# Patient Record
Sex: Male | Born: 1980 | Race: White | Hispanic: No | Marital: Married | Smoking: Current every day smoker
Health system: Southern US, Community
[De-identification: ages and names within clinical notes are randomized; demographics above are authoritative.]

## PROBLEM LIST (undated history)

## (undated) DIAGNOSIS — K219 Gastro-esophageal reflux disease without esophagitis: Secondary | ICD-10-CM

## (undated) DIAGNOSIS — F410 Panic disorder [episodic paroxysmal anxiety] without agoraphobia: Secondary | ICD-10-CM

## (undated) HISTORY — PX: NO PAST SURGERIES: SHX2092

---

## 2016-09-02 ENCOUNTER — Emergency Department: Payer: 59

## 2016-09-02 ENCOUNTER — Emergency Department
Admission: EM | Admit: 2016-09-02 | Discharge: 2016-09-03 | Disposition: A | Discharge status: Home / Self Care | Payer: 59 | Attending: Emergency Medicine | Admitting: Emergency Medicine

## 2016-09-02 DIAGNOSIS — S82851A Displaced trimalleolar fracture of right lower leg, initial encounter for closed fracture: Secondary | ICD-10-CM | POA: Diagnosis not present

## 2016-09-02 DIAGNOSIS — W010XXA Fall on same level from slipping, tripping and stumbling without subsequent striking against object, initial encounter: Secondary | ICD-10-CM | POA: Diagnosis not present

## 2016-09-02 DIAGNOSIS — F17200 Nicotine dependence, unspecified, uncomplicated: Secondary | ICD-10-CM | POA: Diagnosis not present

## 2016-09-02 DIAGNOSIS — Y999 Unspecified external cause status: Secondary | ICD-10-CM | POA: Diagnosis not present

## 2016-09-02 DIAGNOSIS — Y939 Activity, unspecified: Secondary | ICD-10-CM | POA: Diagnosis not present

## 2016-09-02 DIAGNOSIS — S99911A Unspecified injury of right ankle, initial encounter: Secondary | ICD-10-CM | POA: Diagnosis present

## 2016-09-02 DIAGNOSIS — Z8781 Personal history of (healed) traumatic fracture: Secondary | ICD-10-CM

## 2016-09-02 DIAGNOSIS — Y929 Unspecified place or not applicable: Secondary | ICD-10-CM | POA: Insufficient documentation

## 2016-09-02 NOTE — ED Triage Notes (Signed)
Pt presents to ED with c/o RIGHT ankle pain s/p fall on ice PTA. Pt denies any c/o lightheadedness, dizziness prior to mechanical fall. Pt arrives looking pale, states his blood sugar is low although he is not diabetic. Pt with swelling noted to ankle, without deformity. Skin is WPD, pedal pulses present and strong, cap refill <3 secs.

## 2016-09-03 ENCOUNTER — Emergency Department: Payer: 59

## 2016-09-03 MED ORDER — OXYCODONE-ACETAMINOPHEN 5-325 MG PO TABS: 1.0000 | ORAL_TABLET | ORAL | 0 refills | Status: DC | PRN

## 2016-09-03 MED ORDER — DOCUSATE SODIUM 100 MG PO CAPS: ORAL_CAPSULE | ORAL | 0 refills | Status: DC

## 2016-09-03 MED ORDER — OXYCODONE-ACETAMINOPHEN 5-325 MG PO TABS
2.0000 | ORAL_TABLET | Freq: Once | ORAL | Status: AC
Start: 2016-09-03 — End: 2016-09-03
  Administered 2016-09-03: 2 via ORAL
  Filled 2016-09-03: qty 2

## 2016-09-03 NOTE — Consult Note (Signed)
ORTHOPAEDIC CONSULTATION  REQUESTING PHYSICIAN: Loleta Roseory Forbach, MD  Chief Complaint: right ankle pain and swelling  HPI: Turner DanielsJahue W Kilker is a 36 y.o. male who complains of  Right ankle pain and swelling after a fall on the ice at 10 PM. He had immediate pain, 7/10 with inability to bear weight on his right leg. He denies any LOC. He denies any medical problems and does not take any prescription medications. He smokes 1 PPD. He lives at home with his wife and son.  History reviewed. No pertinent past medical history. History reviewed. No pertinent surgical history. Social History   Social History  . Marital status: Married    Spouse name: N/A  . Number of children: N/A  . Years of education: N/A   Social History Main Topics  . Smoking status: Current Every Day Smoker  . Smokeless tobacco: Never Used  . Alcohol use Yes  . Drug use: No  . Sexual activity: Yes    Birth control/ protection: None   Other Topics Concern  . None   Social History Narrative  . None   No family history on file. No Known Allergies Prior to Admission medications   Not on File   Dg Ankle Complete Right  Result Date: 09/02/2016 CLINICAL DATA:  36 y/o  M; right ankle pain and swelling after fall. EXAM: RIGHT ANKLE - COMPLETE 3+ VIEW COMPARISON:  None. FINDINGS: Comminuted oblique right lower fibular fracture above level of tibial plafond. Moderate avulsion of the medial malleolus. Mildly displaced posterior malleolar fracture. Talar dome is intact. Widening of the medial ankle mortise indicates joint instability. Soft tissue swelling about the ankle joint. Small plantar calcaneal enthesophyte. IMPRESSION: Lower fibular fracture above level of tibial plafond. Moderately displaced medial malleolus fracture. Mildly displaced posterior malleolus fracture. Lateral subluxation of talar dome indicating joint instability. Electronically Signed   By: Mitzi HansenLance  Furusawa-Stratton M.D.   On: 09/02/2016 23:51   I reviewed the  radiographs and agree with the assessment of a trimalleolar right ankle fracture.  Positive ROS: All other systems have been reviewed and were otherwise negative with the exception of those mentioned in the HPI and as above.  Physical Exam: General: Alert, no acute distress Cardiovascular: No pedal edema Respiratory: No cyanosis, no use of accessory musculature GI: No organomegaly, abdomen is soft and non-tender Skin: No lesions in the area of chief complaint Neurologic: Sensation intact distally Psychiatric: Patient is competent for consent with normal mood and affect Lymphatic: No axillary or cervical lymphadenopathy  MUSCULOSKELETAL: Marked swelling medially and laterally with no skin wrinkling and fracture blister present along the anteromedial aspect of the distal tibia. He is nontender to palpation about the proximal tibia and knee. He has a 2+ DP pulse, but unable to palpate PT pulse secondary to the amount of swelling present.  Assessment: 36 y/o male with right trimalleolar ankle fracture. Given the amount of soft tissue swelling already present just 2 hours after the injury and the presence of a fracture blister, he is at risk for wound healing complications and infection if we proceed to surgery today. I believe he will benefit from a period of strict elevation and immobilization to allow for soft tissue rest/ resolution of his swelling prior to having surgery. I explained that it could be anywhere from 7-14 days for the swelling to adequately improve.   Plan: 1. Patient was placed into a molded "L and U" splint 2. He was instructed on strict elevation with a goal of maintaining  his leg at or above the level of his heart 3. He was counseled on the risks of DVT/PE along with signs and symptoms. He was instructed to get up on his crutches at least once an hour to help mitigate the risks. He was also counseled to return to the hospital immediately if he develops increasing leg pain,  swelling, chest pain, or shortness of breath. 4. He was counseled on the benefits of tobacco cessation with regards to bone healing. 5. He will follow-up with Dr. Elenor Legato office next week for skin check and surgical planning.       09/03/2016 1:16 AM

## 2016-09-03 NOTE — ED Notes (Signed)
Orthopedic surgeon at bedside. 

## 2016-09-03 NOTE — ED Notes (Signed)
Orthopedic surgeon at bedside to apply splint

## 2016-09-03 NOTE — ED Provider Notes (Signed)
[facility] Emergency Department Provider Note  ____________________________________________   First MD Initiated Contact with Patient 09/03/16 0003     (approximate)  I have reviewed the triage vital signs and the nursing notes.   HISTORY  Chief Complaint Ankle Injury and Fall    HPI Richard Villarreal is a 36 y.o. male with no significant PMH who presents for evaluation of acute onset right ankle pain after slipping on the ice.  No other injuries, did not hit head, no LOC.  Acute onset of severe sharp pain which is worse with any movement of his ankle.  Unable to bear weight.  Moderate swelling. No numbness/tingling, able to move toes and foot.  No injuries higher on the leg.   History reviewed. No pertinent past medical history.  There are no active problems to display for this patient.   History reviewed. No pertinent surgical history.  Prior to Admission medications   Medication Sig Start Date End Date Taking? Authorizing Provider  docusate sodium (COLACE) 100 MG capsule Take 1 tablet once or twice daily as needed for constipation while taking narcotic pain medicine 09/03/16   Loleta Rose, MD  oxyCODONE-acetaminophen (ROXICET) 5-325 MG tablet Take 1-2 tablets by mouth every 4 (four) hours as needed for severe pain. 09/03/16   Loleta Rose, MD    Allergies Patient has no known allergies.  No family history on file.  Social History Social History  Substance Use Topics  . Smoking status: Current Every Day Smoker  . Smokeless tobacco: Never Used  . Alcohol use Yes    Review of Systems Constitutional: No fever/chills Eyes: No visual changes. ENT: No sore throat. Cardiovascular: Denies chest pain. Respiratory: Denies shortness of breath. Gastrointestinal: No abdominal pain.  No nausea, no vomiting.  No diarrhea.  No constipation. Genitourinary: Negative for dysuria. Musculoskeletal: Severe pain in right ankle Skin: Negative for  rash. Neurological: Negative for headaches, focal weakness or numbness.  10-point ROS otherwise negative.  ____________________________________________   PHYSICAL EXAM:  VITAL SIGNS: ED Triage Vitals  Enc Vitals Group     BP 09/02/16 2308 128/90     Pulse Rate 09/02/16 2308 91     Resp 09/02/16 2308 18     Temp 09/02/16 2308 97.8 F (36.6 C)     Temp Source 09/02/16 2308 Oral     SpO2 09/02/16 2308 97 %     Weight 09/02/16 2305 230 lb (104.3 kg)     Height 09/02/16 2305 5\' 8"  (1.727 m)     Head Circumference --      Peak Flow --      Pain Score 09/02/16 2305 10     Pain Loc --      Pain Edu? --      Excl. in GC? --     Constitutional: Alert and oriented. Well appearing and in no acute distress. Eyes: Conjunctivae are normal. PERRL. EOMI. Head: Atraumatic. Nose: No congestion/rhinnorhea. Mouth/Throat: Mucous membranes are moist.  Oropharynx non-erythematous. Neck: No stridor.  No meningeal signs.  No cervical spine tenderness to palpation. Cardiovascular: Normal rate, regular rhythm. Good peripheral circulation. Grossly normal heart sounds. Respiratory: Normal respiratory effort.  No retractions. Lungs CTAB. Gastrointestinal: Soft and nontender. No distention.  Musculoskeletal: Swelling of the right ankle.  N/V intact with normal cap refill and distal pulses.  Severe pain with attempted ROM and palpation, but able to move the foot with some plantar flexion and able to wiggle toes.  No grossly displaced fracture  evident on external visualization. Neurologic:  Normal speech and language. No gross focal neurologic deficits are appreciated.  Skin:  Skin is warm, dry and intact. No rash noted. Psychiatric: Mood and affect are normal. Speech and behavior are normal.  ____________________________________________   LABS (all labs ordered are listed, but only abnormal results are displayed)  Labs Reviewed - No data to  display ____________________________________________  EKG  None - EKG not ordered by ED physician ____________________________________________  RADIOLOGY   Dg Ankle 2 Views Right  Result Date: 09/03/2016 CLINICAL DATA:  Post reduction EXAM: RIGHT ANKLE - 2 VIEW COMPARISON:  09/02/2016 FINDINGS: Interim casting of the right ankle. Comminuted distal fibular fracture with similar alignment. Medial malleolar fracture with decreased displacement and separation. Posterior malleolar fracture similar in alignment. IMPRESSION: Interim casting of the right ankle. Decreased displacement of medial malleolar fracture and less widening of the medial mortise. Distal fibular and posterior malleolar fractures are similar in alignment. Electronically Signed   By: Jasmine PangKim  Fujinaga M.D.   On: 09/03/2016 02:36   Dg Ankle Complete Right  Result Date: 09/02/2016 CLINICAL DATA:  36 y/o  M; right ankle pain and swelling after fall. EXAM: RIGHT ANKLE - COMPLETE 3+ VIEW COMPARISON:  None. FINDINGS: Comminuted oblique right lower fibular fracture above level of tibial plafond. Moderate avulsion of the medial malleolus. Mildly displaced posterior malleolar fracture. Talar dome is intact. Widening of the medial ankle mortise indicates joint instability. Soft tissue swelling about the ankle joint. Small plantar calcaneal enthesophyte. IMPRESSION: Lower fibular fracture above level of tibial plafond. Moderately displaced medial malleolus fracture. Mildly displaced posterior malleolus fracture. Lateral subluxation of talar dome indicating joint instability. Electronically Signed   By: Mitzi HansenLance  Furusawa-Stratton M.D.   On: 09/02/2016 23:51    ____________________________________________   PROCEDURES  Procedure(s) performed:   Procedures   Critical Care performed: No ____________________________________________   INITIAL IMPRESSION / ASSESSMENT AND PLAN / ED COURSE  Pertinent labs & imaging results that were available  during my care of the patient were reviewed by me and considered in my medical decision making (see chart for details).  Unstable ankle fracture.  N/V intact.  No indication for emergent reduction.  Will page orthopedics to discuss appropriate management.  Holding on splint until discussion with specialist.   Clinical Course as of Sep 03 242  Sat Sep 03, 2016  0030 spoke by phone with Dr. Rodena MedinEckel with orthopedic surgery.  He will come in to the ED to evaluate the patient in person and discuss surgical management.  [CF]  [phone] Dr. Rodena MedinEckel evaluated the patient in person and feels he is not a good immediate surgical candidate due to the amount of swelling the patient already has.  He is going to splint the patient and the patient will follow-up with Dr. Ernest PineHooten next week for fixation.  [CF]  [phone] Radiology report on post-reduction films still pending but to my viewing they look improved.  Patient's pain well controlled.  Will discharge as per Dr. Jonette EvaEckel's plan/recommendations.  [CF]  0240 I reviewed the patient's prescription history over the last 12 months in the [place] Controlled Substances Database, and he has had no controlled substances prescribed during that time.  [CF]  [id number] Radiology report does show interval improvement in alignment of fracture.  [CF]    Clinical Course User Index [CF] Loleta Roseory Georgeana Oertel, MD    ____________________________________________  FINAL CLINICAL IMPRESSION(S) / ED DIAGNOSES  Final diagnoses:  Closed right trimalleolar fracture, initial encounter     MEDICATIONS  GIVEN DURING THIS VISIT:  Medications  oxyCODONE-acetaminophen (PERCOCET/ROXICET) 5-325 MG per tablet 2 tablet (2 tablets Oral Given 09/03/16 0034)     NEW OUTPATIENT MEDICATIONS STARTED DURING THIS VISIT:  New Prescriptions   DOCUSATE SODIUM (COLACE) 100 MG CAPSULE    Take 1 tablet once or twice daily as needed for constipation while taking narcotic pain medicine   OXYCODONE-ACETAMINOPHEN (ROXICET) 5-325  MG TABLET    Take 1-2 tablets by mouth every 4 (four) hours as needed for severe pain.    Modified Medications   No medications on file    Discontinued Medications   No medications on file     Note:  This document was prepared using Dragon voice recognition software and may include unintentional dictation errors.    Loleta Rose, MD 09/03/16 [phone]

## 2016-09-03 NOTE — Discharge Instructions (Signed)
Please do not bear weight on your affected leg.  Keep your leg elevated when possible and use ice/cold packs when possible as well.  Follow up next week with Dr. Ernest PineHooten as described above.    Return to the emergency department if you develop new or worsening symptoms that concern you.  Take Percocet as prescribed for severe pain. Do not drink alcohol, drive or participate in any other potentially dangerous activities while taking this medication as it may make you sleepy. Do not take this medication with any other sedating medications, either prescription or over-the-counter. If you were prescribed Percocet or Vicodin, do not take these with acetaminophen (Tylenol) as it is already contained within these medications.   This medication is an opiate (or narcotic) pain medication and can be habit forming.  Use it as little as possible to achieve adequate pain control.  Do not use or use it with extreme caution if you have a history of opiate abuse or dependence.  If you are on a pain contract with your primary care doctor or a pain specialist, be sure to let them know you were prescribed this medication today from the [facility].  This medication is intended for your use only - do not give any to anyone else and keep it in a secure place where nobody else, especially children, have access to it.  It will also cause or worsen constipation, so you may want to consider taking an over-the-counter stool softener while you are taking this medication.

## 2016-09-08 ENCOUNTER — Encounter
Admission: RE | Admit: 2016-09-08 | Discharge: 2016-09-08 | Disposition: A | Discharge status: Home / Self Care | Payer: 59 | Source: Ambulatory Visit | Attending: Podiatry | Admitting: Podiatry

## 2016-09-08 HISTORY — DX: Gastro-esophageal reflux disease without esophagitis: K21.9

## 2016-09-08 HISTORY — DX: Panic disorder (episodic paroxysmal anxiety): F41.0

## 2016-09-08 MED ORDER — CEFAZOLIN SODIUM-DEXTROSE 2-4 GM/100ML-% IV SOLN
2.0000 g | Freq: Once | INTRAVENOUS | Status: AC
  Administered 2016-09-09: 2 g via INTRAVENOUS
  Administered 2016-09-09: 1 g via INTRAVENOUS

## 2016-09-08 NOTE — Patient Instructions (Signed)
  Your procedure is scheduled on: 09-09-16 Report to Same Day Surgery 2nd floor medical mall [facility](Medical Mall Entrance-take elevator on left to 2nd floor.  Check in with surgery information desk.) To find out your arrival time please call [phone] between 1PM - 3PM on 09-08-16  Remember: Instructions that are not followed completely may result in serious medical risk, up to and including death, or upon the discretion of your surgeon and anesthesiologist your surgery may need to be rescheduled.    _x___ 1. Do not eat food or drink liquids after midnight. No gum chewing or hard candies.     __x__ 2. No Alcohol for 24 hours before or after surgery.   __x__3. No Smoking for 24 prior to surgery.   ____  4. Bring all medications with you on the day of surgery if instructed.    __x__ 5. Notify your doctor if there is any change in your medical condition     (cold, fever, infections).     Do not wear jewelry, make-up, hairpins, clips or nail polish.  Do not wear lotions, powders, or perfumes. You may wear deodorant.  Do not shave 48 hours prior to surgery. Men may shave face and neck.  Do not bring valuables to the hospital.    [facility] is not responsible for any belongings or valuables.               Contacts, dentures or bridgework may not be worn into surgery.  Leave your suitcase in the car. After surgery it may be brought to your room.  For patients admitted to the hospital, discharge time is determined by your treatment team.   Patients discharged the day of surgery will not be allowed to drive home.  You will need someone to drive you home and stay with you the night of your procedure.    Please read over the following fact sheets that you were given:   [facility] Preparing for Surgery and or MRSA Information   _x___ Take these medicines the morning of surgery with A SIP OF WATER:    1. MAY TAKE PERCOCET IF NEEDED AM OF SURGERY  2.  3.  4.  5.  6.  ____Fleets enema or  Magnesium Citrate as directed.   ____ Use CHG Soap or sage wipes as directed on instruction sheet   ____ Use inhalers on the day of surgery and bring to hospital day of surgery  ____ Stop metformin 2 days prior to surgery    ____ Take 1/2 of usual insulin dose the night before surgery and none on the morning of surgery.   ____ Stop Aspirin, Coumadin, Pllavix ,Eliquis, Effient, or Pradaxa  x__ Stop Anti-inflammatories such as Advil, Aleve, Ibuprofen, Motrin, Naproxen,          Naprosyn, Goodies powders or aspirin products NOW-Ok to take PERCOCET   ____ Stop supplements until after surgery.    ____ Bring C-Pap to the hospital.

## 2016-09-09 ENCOUNTER — Encounter
Admission: RE | Disposition: A | Discharge status: Home / Self Care | Payer: Self-pay | Source: Ambulatory Visit | Attending: Podiatry

## 2016-09-09 ENCOUNTER — Ambulatory Visit
Admission: RE | Admit: 2016-09-09 | Discharge: 2016-09-09 | Disposition: A | Discharge status: Home / Self Care | Payer: 59 | Source: Ambulatory Visit | Attending: Podiatry | Admitting: Podiatry

## 2016-09-09 ENCOUNTER — Encounter: Payer: Self-pay | Admitting: *Deleted

## 2016-09-09 ENCOUNTER — Ambulatory Visit: Payer: 59 | Admitting: Certified Registered Nurse Anesthetist

## 2016-09-09 ENCOUNTER — Ambulatory Visit: Payer: 59

## 2016-09-09 DIAGNOSIS — S82851A Displaced trimalleolar fracture of right lower leg, initial encounter for closed fracture: Secondary | ICD-10-CM | POA: Diagnosis present

## 2016-09-09 DIAGNOSIS — F17200 Nicotine dependence, unspecified, uncomplicated: Secondary | ICD-10-CM | POA: Diagnosis not present

## 2016-09-09 DIAGNOSIS — Z419 Encounter for procedure for purposes other than remedying health state, unspecified: Secondary | ICD-10-CM

## 2016-09-09 DIAGNOSIS — X58XXXA Exposure to other specified factors, initial encounter: Secondary | ICD-10-CM | POA: Diagnosis not present

## 2016-09-09 HISTORY — PX: ORIF ANKLE FRACTURE: SHX5408

## 2016-09-09 SURGERY — OPEN REDUCTION INTERNAL FIXATION (ORIF) ANKLE FRACTURE
Anesthesia: General | Laterality: Right | Wound class: Clean

## 2016-09-09 MED ORDER — MIDAZOLAM HCL 2 MG/2ML IJ SOLN
INTRAMUSCULAR | Status: AC
  Filled 2016-09-09: qty 2

## 2016-09-09 MED ORDER — ONDANSETRON HCL 4 MG/2ML IJ SOLN
INTRAMUSCULAR | Status: DC | PRN
  Administered 2016-09-09: 4 mg via INTRAVENOUS

## 2016-09-09 MED ORDER — PROPOFOL 10 MG/ML IV BOLUS
INTRAVENOUS | Status: AC
  Filled 2016-09-09: qty 20

## 2016-09-09 MED ORDER — FENTANYL CITRATE (PF) 100 MCG/2ML IJ SOLN
INTRAMUSCULAR | Status: AC
  Filled 2016-09-09: qty 2

## 2016-09-09 MED ORDER — SEVOFLURANE IN SOLN
RESPIRATORY_TRACT | Status: AC
  Filled 2016-09-09: qty 250

## 2016-09-09 MED ORDER — OXYCODONE HCL 5 MG/5ML PO SOLN: 5.0000 mg | Freq: Once | ORAL | Status: AC | PRN

## 2016-09-09 MED ORDER — PROPOFOL 10 MG/ML IV BOLUS
INTRAVENOUS | Status: DC | PRN
  Administered 2016-09-09: 200 mg via INTRAVENOUS

## 2016-09-09 MED ORDER — OXYCODONE HCL 5 MG PO TABS
ORAL_TABLET | ORAL | Status: AC
  Filled 2016-09-09: qty 1

## 2016-09-09 MED ORDER — OXYCODONE HCL 5 MG PO TABS
5.0000 mg | ORAL_TABLET | Freq: Once | ORAL | Status: AC | PRN
  Administered 2016-09-09: 5 mg via ORAL

## 2016-09-09 MED ORDER — LACTATED RINGERS IV SOLN: INTRAVENOUS | Status: DC

## 2016-09-09 MED ORDER — FENTANYL CITRATE (PF) 100 MCG/2ML IJ SOLN
25.0000 ug | INTRAMUSCULAR | Status: DC | PRN
  Administered 2016-09-09: 50 ug via INTRAVENOUS

## 2016-09-09 MED ORDER — MIDAZOLAM HCL 2 MG/2ML IJ SOLN
INTRAMUSCULAR | Status: AC
  Administered 2016-09-09: 2 mg via INTRAVENOUS
  Filled 2016-09-09: qty 4

## 2016-09-09 MED ORDER — FENTANYL CITRATE (PF) 100 MCG/2ML IJ SOLN
INTRAMUSCULAR | Status: DC | PRN
  Administered 2016-09-09 (×2): 50 ug via INTRAVENOUS
  Administered 2016-09-09: 100 ug via INTRAVENOUS

## 2016-09-09 MED ORDER — MEPERIDINE HCL 25 MG/ML IJ SOLN: 6.2500 mg | INTRAMUSCULAR | Status: DC | PRN

## 2016-09-09 MED ORDER — MIDAZOLAM HCL 2 MG/2ML IJ SOLN
INTRAMUSCULAR | Status: DC | PRN
  Administered 2016-09-09: 2 mg via INTRAVENOUS

## 2016-09-09 MED ORDER — FAMOTIDINE 20 MG PO TABS: 20.0000 mg | ORAL_TABLET | Freq: Once | ORAL | Status: DC

## 2016-09-09 MED ORDER — LIDOCAINE HCL (PF) 1 % IJ SOLN
INTRAMUSCULAR | Status: AC
  Filled 2016-09-09: qty 30

## 2016-09-09 MED ORDER — GLYCOPYRROLATE 0.2 MG/ML IJ SOLN
INTRAMUSCULAR | Status: DC | PRN
  Administered 2016-09-09: .1 mg via INTRAVENOUS

## 2016-09-09 MED ORDER — DEXAMETHASONE SODIUM PHOSPHATE 10 MG/ML IJ SOLN
INTRAMUSCULAR | Status: DC | PRN
  Administered 2016-09-09: 5 mg via INTRAVENOUS

## 2016-09-09 MED ORDER — FAMOTIDINE 20 MG PO TABS
ORAL_TABLET | ORAL | Status: AC
  Administered 2016-09-09: 20 mg
  Filled 2016-09-09: qty 1

## 2016-09-09 MED ORDER — LIDOCAINE HCL (CARDIAC) 20 MG/ML IV SOLN
INTRAVENOUS | Status: DC | PRN
  Administered 2016-09-09: 100 mg via INTRAVENOUS

## 2016-09-09 MED ORDER — ONDANSETRON HCL 4 MG PO TABS: 4.0000 mg | ORAL_TABLET | Freq: Four times a day (QID) | ORAL | Status: DC | PRN

## 2016-09-09 MED ORDER — CEFAZOLIN SODIUM 1 G IJ SOLR
INTRAMUSCULAR | Status: AC
  Filled 2016-09-09: qty 10

## 2016-09-09 MED ORDER — CEFAZOLIN SODIUM-DEXTROSE 2-4 GM/100ML-% IV SOLN
INTRAVENOUS | Status: AC
  Filled 2016-09-09: qty 100

## 2016-09-09 MED ORDER — PROMETHAZINE HCL 25 MG/ML IJ SOLN: 6.2500 mg | INTRAMUSCULAR | Status: DC | PRN

## 2016-09-09 MED ORDER — ROPIVACAINE HCL 5 MG/ML IJ SOLN
INTRAMUSCULAR | Status: DC | PRN
  Administered 2016-09-09: 15 mL
  Administered 2016-09-09: 25 mL via PERINEURAL

## 2016-09-09 MED ORDER — ROPIVACAINE HCL 5 MG/ML IJ SOLN
INTRAMUSCULAR | Status: AC
  Filled 2016-09-09: qty 40

## 2016-09-09 MED ORDER — ACETAMINOPHEN 10 MG/ML IV SOLN
INTRAVENOUS | Status: AC
  Filled 2016-09-09: qty 100

## 2016-09-09 MED ORDER — ACETAMINOPHEN 10 MG/ML IV SOLN
INTRAVENOUS | Status: DC | PRN
  Administered 2016-09-09: 1000 mg via INTRAVENOUS

## 2016-09-09 MED ORDER — LACTATED RINGERS IV SOLN
INTRAVENOUS | Status: DC
  Administered 2016-09-09: 09:00:00 via INTRAVENOUS

## 2016-09-09 MED ORDER — ONDANSETRON HCL 4 MG/2ML IJ SOLN: 4.0000 mg | Freq: Four times a day (QID) | INTRAMUSCULAR | Status: DC | PRN

## 2016-09-09 MED ORDER — LEVOFLOXACIN IN D5W 500 MG/100ML IV SOLN
INTRAVENOUS | Status: AC
  Filled 2016-09-09: qty 100

## 2016-09-09 MED ORDER — OXYCODONE-ACETAMINOPHEN 5-325 MG PO TABS: 1.0000 | ORAL_TABLET | ORAL | Status: DC | PRN

## 2016-09-09 MED ORDER — MIDAZOLAM HCL 2 MG/2ML IJ SOLN
2.0000 mg | Freq: Once | INTRAMUSCULAR | Status: AC
  Administered 2016-09-09: 2 mg via INTRAVENOUS

## 2016-09-09 MED ORDER — LIDOCAINE HCL (PF) 1 % IJ SOLN
INTRAMUSCULAR | Status: AC
  Filled 2016-09-09: qty 5

## 2016-09-09 MED ORDER — BUPIVACAINE HCL (PF) 0.5 % IJ SOLN
INTRAMUSCULAR | Status: AC
  Filled 2016-09-09: qty 30

## 2016-09-09 MED ORDER — LIDOCAINE HCL (PF) 1 % IJ SOLN
INTRAMUSCULAR | Status: DC | PRN
  Administered 2016-09-09: 3 mL

## 2016-09-09 SURGICAL SUPPLY — 52 items
BANDAGE ELASTIC 4 LF NS (GAUZE/BANDAGES/DRESSINGS) ×2 IMPLANT
BANDAGE STRETCH 3X4.1 STRL (GAUZE/BANDAGES/DRESSINGS) ×2 IMPLANT
BIT DRILL 2.9MM (BIT) ×1
BIT DRILL 2.9X70 QC CALB (BIT) ×2 IMPLANT
BIT DRILL CALIBRATED 2.7 (BIT) ×2 IMPLANT
BIT DRILL LCP CANN QC 2.9X150 (BIT) ×1 IMPLANT
BLADE SURG 15 STRL LF DISP TIS (BLADE) ×2 IMPLANT
BLADE SURG 15 STRL SS (BLADE) ×2
BNDG COHESIVE 4X5 TAN STRL (GAUZE/BANDAGES/DRESSINGS) ×2 IMPLANT
BNDG ESMARK 4X12 TAN STRL LF (GAUZE/BANDAGES/DRESSINGS) ×2 IMPLANT
BNDG GAUZE 4.5X4.1 6PLY STRL (MISCELLANEOUS) ×2 IMPLANT
CANISTER SUCT 1200ML W/VALVE (MISCELLANEOUS) ×2 IMPLANT
DRAPE C-ARM XRAY 36X54 (DRAPES) ×2 IMPLANT
DRAPE C-ARMOR (DRAPES) ×2 IMPLANT
DRILL BIT 2.9MM (BIT) ×1
DURAPREP 26ML APPLICATOR (WOUND CARE) ×2 IMPLANT
ELECT REM PT RETURN 9FT ADLT (ELECTROSURGICAL) ×2
ELECTRODE REM PT RTRN 9FT ADLT (ELECTROSURGICAL) ×1 IMPLANT
GAUZE PETRO XEROFOAM 1X8 (MISCELLANEOUS) ×2 IMPLANT
GAUZE SPONGE 4X4 12PLY STRL (GAUZE/BANDAGES/DRESSINGS) ×2 IMPLANT
GAUZE STRETCH 2X75IN STRL (MISCELLANEOUS) ×2 IMPLANT
GLOVE BIO SURGEON STRL SZ7.5 (GLOVE) ×8 IMPLANT
GLOVE INDICATOR 8.0 STRL GRN (GLOVE) ×6 IMPLANT
GOWN STRL REUS W/ TWL LRG LVL3 (GOWN DISPOSABLE) ×3 IMPLANT
GOWN STRL REUS W/TWL LRG LVL3 (GOWN DISPOSABLE) ×3
K-WIRE ACE 1.6X6 (WIRE) ×6
KIT RM TURNOVER STRD PROC AR (KITS) ×2 IMPLANT
KWIRE ACE 1.6X6 (WIRE) ×3 IMPLANT
LABEL OR SOLS (LABEL) ×2 IMPLANT
NS IRRIG 500ML POUR BTL (IV SOLUTION) ×2 IMPLANT
PACK EXTREMITY ARMC (MISCELLANEOUS) ×2 IMPLANT
PAD PREP 24X41 OB/GYN DISP (PERSONAL CARE ITEMS) ×2 IMPLANT
PLATE LOCK 8H 103 BILAT FIB (Plate) ×2 IMPLANT
SCREW ACE CAN 4.0 50M (Screw) ×4 IMPLANT
SCREW LOCK CORT STAR 3.5X12 (Screw) ×6 IMPLANT
SCREW LOCK CORT STAR 3.5X14 (Screw) ×4 IMPLANT
SCREW LOW PROFILE 22MMX3.5MM (Screw) ×2 IMPLANT
SCREW NON LOCKING LP 3.5 16MM (Screw) ×2 IMPLANT
SPLINT CAST 1 STEP 4X30 (MISCELLANEOUS) ×2 IMPLANT
SPLINT FAST PLASTER 5X30 (CAST SUPPLIES) ×1
SPLINT PLASTER CAST FAST 5X30 (CAST SUPPLIES) ×1 IMPLANT
SPONGE LAP 18X18 5 PK (GAUZE/BANDAGES/DRESSINGS) ×2 IMPLANT
STAPLER SKIN PROX 35W (STAPLE) ×2 IMPLANT
STOCKINETTE M/LG 89821 (MISCELLANEOUS) ×2 IMPLANT
STRAP SAFETY BODY (MISCELLANEOUS) ×2 IMPLANT
STRIP CLOSURE SKIN 1/2X4 (GAUZE/BANDAGES/DRESSINGS) IMPLANT
SUT VIC AB 2-0 CT1 27 (SUTURE) ×3
SUT VIC AB 2-0 CT1 TAPERPNT 27 (SUTURE) ×3 IMPLANT
SUT VIC AB 3-0 SH 27 (SUTURE) ×3
SUT VIC AB 3-0 SH 27X BRD (SUTURE) ×3 IMPLANT
SWABSTK COMLB BENZOIN TINCTURE (MISCELLANEOUS) IMPLANT
SYRINGE 10CC LL (SYRINGE) ×2 IMPLANT

## 2016-09-09 NOTE — Anesthesia Procedure Notes (Addendum)
Anesthesia Regional Block:  Popliteal block  Pre-Anesthetic Checklist: ,, timeout performed, Correct Patient, Correct Site, Correct Laterality, Correct Procedure, Correct Position, site marked, Risks and benefits discussed,  Surgical consent,  Pre-op evaluation,  At surgeon's request and post-op pain management  Laterality: Right  Prep: chloraprep       Needles:  Injection technique: Single-shot  Needle Type: Echogenic Needle     Needle Length: 9cm 9 cm Needle Gauge: 21 and 21 G    Additional Needles:  Procedures: ultrasound guided (picture in chart) Popliteal block Narrative:  Start time: 09/09/2016 9:35 AM End time: 09/09/2016 9:45 AM Injection made incrementally with aspirations every 5 mL.  Performed by: Personally  Anesthesiologist: Nickcole Bralley  Additional Notes: Functioning IV was confirmed and monitors were applied.  A 50mm 22ga Stimuplex needle was used. Sterile prep and drape,hand hygiene and sterile gloves were used.  Negative aspiration and negative test dose prior to incremental administration of local anesthetic. The patient tolerated the procedure well.  Supplemental saphenous block also performed

## 2016-09-09 NOTE — Anesthesia Postprocedure Evaluation (Signed)
Anesthesia Post Note  Patient: Richard Villarreal  Procedure(s) Performed: Procedure(s) (LRB): OPEN REDUCTION INTERNAL FIXATION (ORIF) ANKLE FRACTURE (Right)  Patient location during evaluation: PACU Anesthesia Type: General Level of consciousness: awake and alert and oriented Pain management: pain level controlled Vital Signs Assessment: post-procedure vital signs reviewed and stable Respiratory status: spontaneous breathing, nonlabored ventilation and respiratory function stable Cardiovascular status: blood pressure returned to baseline and stable Postop Assessment: no signs of nausea or vomiting Anesthetic complications: no     Last Vitals:  Vitals:   09/09/16 1412 09/09/16 1434  BP: (!) 155/99 (!) 149/86  Pulse: (!) 113 (!) 103  Resp: (!) 21 20  Temp: 37.1 C 36.5 C    Last Pain:  Vitals:   09/09/16 1434  TempSrc: Temporal  PainSc: 3                  Lamine Laton

## 2016-09-09 NOTE — Discharge Instructions (Signed)
AMBULATORY SURGERY  DISCHARGE INSTRUCTIONS   1) The drugs that you were given will stay in your system until tomorrow so for the next 24 hours you should not:  A) Drive an automobile B) Make any legal decisions C) Drink any alcoholic beverage   2) You may resume regular meals tomorrow.  Today it is better to start with liquids and gradually work up to solid foods.  You may eat anything you prefer, but it is better to start with liquids, then soup and crackers, and gradually work up to solid foods.   3) Please notify your doctor immediately if you have any unusual bleeding, trouble breathing, redness and pain at the surgery site, drainage, fever, or pain not relieved by medication.   4) Additional Instructions:   [facility]  POST OPERATIVE INSTRUCTIONS FOR DR. TROXLER AND [facility]   1. Take your medication as prescribed.  Pain medication should be taken only as needed.  2. Keep the dressing clean, dry and intact.  3. Keep your foot elevated above the heart level for the first 48 hours.  4. Walking to the bathroom and brief periods of walking are acceptable, unless we have instructed you to be non-weight bearing.  5. Always wear your post-op shoe when walking.  Always use your crutches if you are to be non-weight bearing.  6. Do not take a shower. Baths are permissible as long as the foot is kept out of the water.   7. Every hour you are awake:  - Bend your knee 15 times. - Massage calf 15 times  8. Call [facility] [phone] if any of the following problems occur: - You develop a temperature or fever. - The bandage becomes saturated with blood. - Medication does not stop your pain. - Injury of the foot occurs. - Any symptoms of infection including redness, odor, or red streaks running from wound.

## 2016-09-09 NOTE — OR Nursing (Signed)
To PACU bay 5 for block via stretcher

## 2016-09-09 NOTE — Op Note (Signed)
Operative note   Surgeon:Erron Wengert [employer] logistics/support/administrative officerowler    Assistant: None    Preop diagnosis: Right trimalleolar ankle fracture    Postop diagnosis: Same    Procedure: Open reduction with internal fixation right trimalleolar ankle fracture without posterior malleolus fixation    EBL: 10 ML's    Anesthesia:regional and general    Hemostasis: Thigh tourniquet inflated to 250 mmHg for 120 minutes    Specimen: None    Complications: None    Operative indications:Richard Villarreal is an 36 y.o. that presents today for surgical intervention.  The risks/benefits/alternatives/complications have been discussed and consent has been given.    Procedure:  Patient was brought into the OR and placed on the operating table in thesupine position. After anesthesia was obtained theright lower extremity was prepped and draped in usual sterile fashion.  Attention was directed to the lateral aspect of the right ankle where a longitudinal incision was then placed. Sharp and blunt dissection carried down to the periosteum. Subperiosteal dissection was then undertaken. The wound was then flushed with copious amounts or irrigation. The fracture was clamped and reduced in anatomic position. At this time the plate was initially placed on the lateral aspect of the ankle for fixation.Prior to drilling for the initial screw the drill bit was found to have contamination of possible tissue.  At this time all instrumentation was removed and the entire area was reprepped and draped, all staff re-gowned and gloved and all new sterile instrumentation was utilized. The wound was flushed with copious amounts or irrigation at this time.  The fracture was reduced and at this time a Biomet 8 hole locking plate was applied. 4 holes distal to the fracture and 3 holes proximal to the fracture were applied. Nonlocking screws were used to contour the plate directly against the bone and finished with further locking screws. Good anatomic alignment was  noted in all views. Was flushed with copious amounts of irrigation.  Attention was then directed to the medial aspect of the ankle where a curved incision was performed along the medial aspect. Sharp and blunt dissection carried down to the periosteum. The saphenous vein was noted and retracted anteriorly. The medial malleolus fracture was noted at this time. This was then reduced and stabilized with 2 guidewires for cannulated screws. The screw holes were drilled and 2 x 50 mm 4.0 mm screws were then applied with the fracture in anatomic position. At this time all wounds were once again flushed with copious amounts or irrigation. Layered closure was then performed with a 3-0 Vicryl for the deeper and subcutaneous tissue and skin staples for skin. A large bulky sterile dressing was then applied.  Baseline labs were drawn as recommended by infection control.    Patient tolerated the procedure and anesthesia well.  Was transported from the OR to the PACU with all vital signs stable and vascular status intact. To be discharged per routine protocol.  Will follow up in approximately 1 week in the outpatient clinic.

## 2016-09-09 NOTE — H&P (Signed)
  HISTORY AND PHYSICAL INTERVAL NOTE:  09/09/2016  8:59 AM  Richard Villarreal  has presented today for surgery, with the diagnosis of right ankle fracture.  The various methods of treatment have been discussed with the patient.  No guarantees were given.  After consideration of risks, benefits and other options for treatment, the patient has consented to surgery.  I have reviewed the patients' chart and labs.    No data found.   A history and physical examination was performed in my office.  The patient was reexamined.  There have been no changes to this history and physical examination. Plan for ORIF.  Gwyneth RevelsFowler, [name] A

## 2016-09-09 NOTE — Anesthesia Post-op Follow-up Note (Cosign Needed)
Anesthesia QCDR form completed.        

## 2016-09-09 NOTE — Transfer of Care (Signed)
Immediate Anesthesia Transfer of Care Note  Patient: Richard Villarreal  Procedure(s) Performed: Procedure(s): OPEN REDUCTION INTERNAL FIXATION (ORIF) ANKLE FRACTURE (Right)  Patient Location: PACU  Anesthesia Type:General  Level of Consciousness: sedated  Airway & Oxygen Therapy: Patient Spontanous Breathing and Patient connected to nasal cannula oxygen  Post-op Assessment: Report given to RN and Post -op Vital signs reviewed and stable  Post vital signs: Reviewed and stable  Last Vitals:  Vitals:   09/09/16 0942 09/09/16 1313  BP: 121/86   Pulse: 90   Resp: (!) 26   Temp:  36.7 C    Last Pain:  Vitals:   09/09/16 1313  TempSrc: Temporal         Complications: No apparent anesthesia complications

## 2016-09-09 NOTE — Anesthesia Procedure Notes (Signed)
Procedure Name: LMA Insertion Date/Time: 09/09/2016 10:00 AM Performed by: Derinda LateIACONE, Tabor Bartram Pre-anesthesia Checklist: Patient identified, Patient being monitored, Timeout performed, Emergency Drugs available and Suction available Patient Re-evaluated:Patient Re-evaluated prior to inductionOxygen Delivery Method: Circle system utilized Preoxygenation: Pre-oxygenation with 100% oxygen Intubation Type: IV induction Ventilation: Mask ventilation without difficulty LMA: LMA inserted LMA Size: 5.0 Tube type: Oral Number of attempts: 1 Placement Confirmation: positive ETCO2 and breath sounds checked- equal and bilateral Tube secured with: Tape Dental Injury: Teeth and Oropharynx as per pre-operative assessment

## 2016-09-09 NOTE — Anesthesia Preprocedure Evaluation (Addendum)
Anesthesia Evaluation  Patient identified by MRN, date of birth, ID band Patient awake    Reviewed: Allergy & Precautions, NPO status , Patient's Chart, lab work & pertinent test results  History of Anesthesia Complications Negative for: history of anesthetic complications  Airway Mallampati: II  TM Distance: >3 FB Neck ROM: Full    Dental no notable dental hx.    Pulmonary neg sleep apnea, neg COPD, Current Smoker,    breath sounds clear to auscultation- rhonchi (-) wheezing      Cardiovascular Exercise Tolerance: Good (-) hypertension(-) CAD and (-) Past MI  Rhythm:Regular Rate:Normal - Systolic murmurs and - Diastolic murmurs    Neuro/Psych Anxiety negative neurological ROS     GI/Hepatic Neg liver ROS, GERD  ,  Endo/Other  negative endocrine ROSneg diabetes  Renal/GU negative Renal ROS     Musculoskeletal R ankle fracture   Abdominal (+) + obese,   Peds  Hematology negative hematology ROS (+)   Anesthesia Other Findings Past Medical History: No date: GERD (gastroesophageal reflux disease) No date: Panic attacks   Reproductive/Obstetrics                             Anesthesia Physical Anesthesia Plan  ASA: II  Anesthesia Plan: General   Post-op Pain Management:  Regional for Post-op pain   Induction: Intravenous  Airway Management Planned: LMA  Additional Equipment:   Intra-op Plan:   Post-operative Plan:   Informed Consent: I have reviewed the patients History and Physical, chart, labs and discussed the procedure including the risks, benefits and alternatives for the proposed anesthesia with the patient or authorized representative who has indicated his/her understanding and acceptance.   Dental advisory given  Plan Discussed with: CRNA and Anesthesiologist  Anesthesia Plan Comments:        Anesthesia Quick Evaluation

## 2016-09-09 NOTE — OR Nursing (Signed)
Dr. Ether GriffinsFowler into se pt to explain what happened in the OR regarding surgical instruments and need for follow up appt with Dr. Sampson GoonFitzgerald.

## 2016-09-10 LAB — HCV COMMENT:

## 2016-09-10 LAB — HEPATITIS B SURFACE ANTIGEN: Hepatitis B Surface Ag: NEGATIVE

## 2016-09-10 LAB — HIV ANTIBODY (ROUTINE TESTING W REFLEX): HIV Screen 4th Generation wRfx: NONREACTIVE

## 2016-09-10 LAB — HEPATITIS C ANTIBODY (REFLEX): HCV Ab: <0.1 s/co ratio (ref 0.0–0.9)

## 2016-09-12 ENCOUNTER — Encounter: Payer: Self-pay | Admitting: Podiatry

## 2016-10-10 ENCOUNTER — Inpatient Hospital Stay: Admit: 2016-10-10 | Payer: Self-pay

## 2016-10-31 ENCOUNTER — Other Ambulatory Visit
Admission: RE | Admit: 2016-10-31 | Discharge: 2016-10-31 | Disposition: A | Discharge status: Home / Self Care | Payer: 59 | Source: Ambulatory Visit | Attending: Infectious Diseases | Admitting: Infectious Diseases

## 2016-10-31 LAB — RAPID HIV SCREEN (HIV 1/2 AB+AG)
HIV 1/2 Antibodies: NONREACTIVE
HIV-1 P24 ANTIGEN - HIV24: NONREACTIVE

## 2016-11-01 LAB — HEPATITIS C ANTIBODY

## 2016-11-02 LAB — HEPATITIS B VIRUS (PROFILE VI)
HEP B C TOTAL AB: NEGATIVE
HEP B E AB: NEGATIVE
HEP B S AB: NONREACTIVE
HEP B S AG: NEGATIVE
Hep B C IgM: NEGATIVE
Hep B E Ag: NEGATIVE

## 2017-01-27 ENCOUNTER — Ambulatory Visit (INDEPENDENT_AMBULATORY_CARE_PROVIDER_SITE_OTHER): Payer: 59 | Admitting: Nurse Practitioner

## 2017-01-27 ENCOUNTER — Encounter: Payer: Self-pay | Admitting: Nurse Practitioner

## 2017-01-27 VITALS — BP 129/81 | HR 101 | Temp 98.2°F | Ht 68.0 in | Wt 204.2 lb

## 2017-01-27 DIAGNOSIS — IMO0001 Reserved for inherently not codable concepts without codable children: Secondary | ICD-10-CM

## 2017-01-27 DIAGNOSIS — Z7689 Persons encountering health services in other specified circumstances: Secondary | ICD-10-CM | POA: Diagnosis not present

## 2017-01-27 DIAGNOSIS — R350 Frequency of micturition: Secondary | ICD-10-CM

## 2017-01-27 DIAGNOSIS — E1165 Type 2 diabetes mellitus with hyperglycemia: Secondary | ICD-10-CM | POA: Diagnosis not present

## 2017-01-27 DIAGNOSIS — E119 Type 2 diabetes mellitus without complications: Secondary | ICD-10-CM | POA: Insufficient documentation

## 2017-01-27 LAB — POCT URINALYSIS DIPSTICK
Bilirubin, UA: NEGATIVE
Glucose, UA: 2000
Ketones, UA: 80
Leukocytes, UA: NEGATIVE
Nitrite, UA: NEGATIVE
Protein, UA: NEGATIVE
Spec Grav, UA: 1.01 (ref 1.010–1.025)
Urobilinogen, UA: 0.2 E.U./dL
pH, UA: 5 (ref 5.0–8.0)

## 2017-01-27 LAB — POCT GLYCOSYLATED HEMOGLOBIN (HGB A1C): Hemoglobin A1C: 11.8

## 2017-01-27 LAB — GLUCOSE, POCT (MANUAL RESULT ENTRY): POC Glucose: 448 mg/dl — AB (ref 70–99)

## 2017-01-27 MED ORDER — METFORMIN HCL 500 MG PO TABS: 500.0000 mg | ORAL_TABLET | Freq: Two times a day (BID) | ORAL | 1 refills | Status: DC

## 2017-01-27 MED ORDER — BLOOD GLUCOSE MONITOR KIT: PACK | 0 refills | Status: AC

## 2017-01-27 NOTE — Patient Instructions (Addendum)
Richard Villarreal, Thank you for coming in to clinic today.  1. You likely have diabetes.  I cannot make an official diagnosis until confirmed with another high hemoglobin A1c. - START metformin 500 mg tablet once daily for 2 weeks. - INCREASE to metformin 500 mg tablet twice daily w/ meals for 2 weeks.   - START checking your blood sugars at home with the log I gave you - Try to keep a food log especially for meals you check your cbg before and after.  Keep food log for everything if you can for the 2 weeks prior to our next visit.  - www.diabetes.org - american diabetes association is a great source for information.  You will be due for FASTING BLOOD WORK (no food or drink after midnight before, only water or coffee without cream/sugar on the morning of)  - Please go ahead and schedule a "Lab Only" visit in the morning at the clinic for lab draw BEFORE next friday   - Make sure Lab Only appointment is at least 1-2 weeks before your next appointment, so that results will be available  For Lab Results, once available within 2-3 days of blood draw, you can can log in to MyChart online to view your results and a brief explanation. Also, we can discuss results at next follow-up visit.  Please schedule a follow-up appointment with [name], AGNP to Return in about 4 weeks (around 02/24/2017) for diabetes.  If you have any other questions or concerns, please feel free to call the clinic or send a message through MyChart. You may also schedule an earlier appointment if necessary.  [name], DNP, AGNP-BC Adult Gerontology Nurse Practitioner [facility], [facility]   Blood Glucose Monitoring, Adult Monitoring your blood sugar (glucose) helps you manage your diabetes. It also helps you and your health care provider determine how well your diabetes management plan is working. Blood glucose monitoring involves checking your blood glucose as often as directed, and keeping a record (log) of  your results over time. Why should I monitor my blood glucose? Checking your blood glucose regularly can:  Help you understand how food, exercise, illnesses, and medicines affect your blood glucose.  Let you know what your blood glucose is at any time. You can quickly tell if you are having low blood glucose (hypoglycemia) or high blood glucose (hyperglycemia).  Help you and your health care provider adjust your medicines as needed.  When should I check my blood glucose? Follow instructions from your health care provider about how often to check your blood glucose. This may depend on:  The type of diabetes you have.  How well-controlled your diabetes is.  Medicines you are taking.  If you have type 1 diabetes:  Check your blood glucose at least 2 times a day.  Also check your blood glucose: ? Before every insulin injection. ? Before and after exercise. ? Between meals. ? 2 hours after a meal. ? Occasionally between 2:00 a.m. and 3:00 a.m., as directed. ? Before potentially dangerous tasks, like driving or using heavy machinery. ? At bedtime.  You may need to check your blood glucose more often, up to 6-10 times a day: ? If you use an insulin pump. ? If you need multiple daily injections (MDI). ? If your diabetes is not well-controlled. ? If you are ill. ? If you have a history of severe hypoglycemia. ? If you have a history of not knowing when your blood glucose is getting low (hypoglycemia  unawareness). If you have type 2 diabetes:  If you take insulin or other diabetes medicines, check your blood glucose at least 2 times a day.  If you are on intensive insulin therapy, check your blood glucose at least 4 times a day. Occasionally, you may also need to check between 2:00 a.m. and 3:00 a.m., as directed.  Also check your blood glucose: ? Before and after exercise. ? Before potentially dangerous tasks, like driving or using heavy machinery.  You may need to check your  blood glucose more often if: ? Your medicine is being adjusted. ? Your diabetes is not well-controlled. ? You are ill. What is a blood glucose log?  A blood glucose log is a record of your blood glucose readings. It helps you and your health care provider: ? Look for patterns in your blood glucose over time. ? Adjust your diabetes management plan as needed.  Every time you check your blood glucose, write down your result and notes about things that may be affecting your blood glucose, such as your diet and exercise for the day.  Most glucose meters store a record of glucose readings in the meter. Some meters allow you to download your records to a computer. How do I check my blood glucose? Follow these steps to get accurate readings of your blood glucose: Supplies needed   Blood glucose meter.  Test strips for your meter. Each meter has its own strips. You must use the strips that come with your meter.  A needle to prick your finger (lancet). Do not use lancets more than once.  A device that holds the lancet (lancing device).  A journal or log book to write down your results. Procedure  Wash your hands with soap and water.  Prick the side of your finger (not the tip) with the lancet. Use a different finger each time.  Gently rub the finger until a small drop of blood appears.  Follow instructions that come with your meter for inserting the test strip, applying blood to the strip, and using your blood glucose meter.  Write down your result and any notes. Alternative testing sites  Some meters allow you to use areas of your body other than your finger (alternative sites) to test your blood.  If you think you may have hypoglycemia, or if you have hypoglycemia unawareness, do not use alternative sites. Use your finger instead.  Alternative sites may not be as accurate as the fingers, because blood flow is slower in these areas. This means that the result you get may be  delayed, and it may be different from the result that you would get from your finger.  The most common alternative sites are: ? Forearm. ? Thigh. ? Palm of the hand. Additional tips  Always keep your supplies with you.  If you have questions or need help, all blood glucose meters have a 24-hour "hotline" number that you can call. You may also contact your health care provider.  After you use a few boxes of test strips, adjust (calibrate) your blood glucose meter by following instructions that came with your meter. This information is not intended to replace advice given to you by your health care provider. Make sure you discuss any questions you have with your health care provider. Document Released: 08/04/2003 Document Revised: 02/19/2016 Document Reviewed: 01/11/2016 Elsevier Interactive Patient Education  2017 [employer]

## 2017-01-27 NOTE — Assessment & Plan Note (Signed)
Uncontrolled diabetes.  New onset diabetes w/ symptom manifestation of polyuria and polydipsia.  Pt obese w/ positive family history of DMT2 in his mother.  Plan: 1. Initial DM education includes basics of disease pathophysiology, s/sx ofhyperglycemia, s/sx of hypoglycemia, initial likelihood to have hypoglycemic symptoms even if not hypoglycemic. 2. Work on diet to decrease sugary drinks. Discussed carbohydrate intake goals and reviewed low glycemic diet. 3. Discussed starting metformin along with diet and exercise. Pt agreeable.  Start metformin 500 mg once daily x 2 weeks.  Increase to metformin 500 mg twice daily with meals until next appointment. 4. Check CBG am fasting, preprandial and postprandial rotating breakfast, lunch and dinner.  Also check HS sugar. Discussed that pt will not need to continue checking this many blood sugars daily in future, only for establishing patterns of eating habits and glucose control. 5. Follow up 4 weeks.

## 2017-01-27 NOTE — Progress Notes (Signed)
Subjective:    Patient ID: Richard Villarreal, male    DOB: 05-Jul-1981, 36 y.o.   MRN: [id number]  Richard Villarreal is a 36 y.o. male presenting on 01/27/2017 for Urinary Frequency (x 1 week)  Pt is accompanied by his wife, Colletta [place], who is also a patient in clinic.  HPI Establish Care New Provider Pt last seen by PCP in 2001.  He has had recent surgery w/ hardware placement after malleolar ankle fractures.  He had surgery in January, was seen yesterday for site infection of medial malleolus.  Currently on augmentin for infection.   Urinary Frequency Pt w/ increased urinary frequency x 1 week. Has felt urge and had a need to void about every 20 minutes.  He has associated symptoms which include excessive thirst, excessive urination daytime and nighttime.  Denies burning, back/flank pain, fever, chills, sweats. He notes he is often lightheaded if he doesn't eat breakfast.  If he eats a snack then symptoms go away. Weight loss w/ weight change from 220 to 204 in last 3-4 weeks.  He has not tried to lose weight.  UA today w/ ketones and glucose. CBG in office today: 448, but he has had coffee this am w/ sweetened creamer. POCT A1c done today also = 11.8  Family history of diabetes in patient's mother.  His mother initially controlled with diet and exercise. Is now on medications, but did not need them early in disease.   Past Medical History:  Diagnosis Date  . GERD (gastroesophageal reflux disease)   . Panic attacks    Past Surgical History:  Procedure Laterality Date  . NO PAST SURGERIES    . ORIF ANKLE FRACTURE Right 09/09/2016   Procedure: OPEN REDUCTION INTERNAL FIXATION (ORIF) ANKLE FRACTURE;  Surgeon: Samara Deist, DPM;  Location: [facility] ORS;  Service: Podiatry;  Laterality: Right;   Social History   Social History  . Marital status: Married    Spouse name: N/A  . Number of children: N/A  . Years of education: N/A   Occupational History  . Not on file.   Social History  Main Topics  . Smoking status: Current Every Day Smoker    Packs/day: 0.50    Years: 13.00    Types: Cigarettes  . Smokeless tobacco: Never Used     Comment: VAPES ALSO  . Alcohol use No     Comment: SOCIALLY  . Drug use: No  . Sexual activity: Yes    Birth control/ protection: None     Comment: partner w/ tubal   Other Topics Concern  . Not on file   Social History Narrative  . No narrative on file   Family History  Problem Relation Age of Onset  . Diabetes Mother   . Breast cancer Mother   . Brain cancer Maternal Grandfather   . Cancer Maternal Grandfather        brain  . Lung cancer Paternal Grandfather   . Cancer Paternal Grandfather        lung  . Hypertension Father   . Healthy Sister   . Healthy Maternal Grandmother   . Stroke Paternal Grandmother    Current Outpatient Prescriptions on File Prior to Visit  Medication Sig  . ibuprofen (ADVIL,MOTRIN) 200 MG tablet Take 800 mg by mouth every 8 (eight) hours as needed (for pain.).   No current facility-administered medications on file prior to visit.     Review of Systems  Constitutional: Positive for unexpected weight change.  HENT:  Negative.   Eyes: Positive for visual disturbance.  Respiratory: Negative.   Cardiovascular: Negative.   Gastrointestinal: Negative.   Endocrine: Positive for polydipsia and polyuria.  Genitourinary: Positive for dysuria.  Musculoskeletal: Negative.   Skin: Negative.   Allergic/Immunologic: Negative.   Neurological: Positive for light-headedness and headaches.  Hematological: Negative.   Psychiatric/Behavioral: Negative.    Per HPI unless specifically indicated above      Objective:    BP 129/81 (BP Location: Left Arm, Patient Position: Sitting, Cuff Size: Large)   Pulse (!) 101   Temp 98.2 F (36.8 C) (Oral)   Ht 5' 8"  (1.727 m)   Wt 204 lb 3.2 oz (92.6 kg)   SpO2 96%   BMI 31.05 kg/m   Wt Readings from Last 3 Encounters:  01/27/17 204 lb 3.2 oz (92.6 kg)    09/09/16 230 lb (104.3 kg)  09/02/16 230 lb (104.3 kg)    Physical Exam  Constitutional: He is oriented to person, place, and time. He appears well-developed and well-nourished. No distress.  HENT:  Head: Normocephalic and atraumatic.  Neck: Normal range of motion. Neck supple. No JVD present. No tracheal deviation present. No thyromegaly present.  Negative carotid bruit  Cardiovascular: Normal rate, regular rhythm, normal heart sounds and intact distal pulses.   Pulmonary/Chest: Breath sounds normal. No respiratory distress.  Abdominal: Soft. Bowel sounds are normal. He exhibits no distension and no mass. There is no tenderness.  Musculoskeletal: He exhibits no edema.  Lymphadenopathy:    He has no cervical adenopathy.  Neurological: He is alert and oriented to person, place, and time.  Skin: Skin is warm and dry.  Psychiatric: He has a normal mood and affect. His behavior is normal. Judgment and thought content normal.  Vitals reviewed.   Results for orders placed or performed in visit on 01/27/17  POCT Urinalysis Dipstick  Result Value Ref Range   Color, UA yellow    Clarity, UA clear    Glucose, UA 2,000    Bilirubin, UA neg    Ketones, UA 80    Spec Grav, UA 1.010 1.010 - 1.025   Blood, UA trace    pH, UA 5.0 5.0 - 8.0   Protein, UA neg    Urobilinogen, UA 0.2 0.2 or 1.0 E.U./dL   Nitrite, UA neg    Leukocytes, UA Negative Negative  POCT glycosylated hemoglobin (Hb A1C)  Result Value Ref Range   Hemoglobin A1C 11.8   POCT glucose (manual entry)  Result Value Ref Range   POC Glucose 448 (A) 70 - 99 mg/dl      Assessment & Plan:   Problem List Items Addressed This Visit      Endocrine   Uncontrolled type 2 diabetes mellitus without complication, without long-term current use of insulin (HCC)    Uncontrolled diabetes.  New onset diabetes w/ symptom manifestation of polyuria and polydipsia.  Pt obese w/ positive family history of DMT2 in his mother.  Plan: 1.  Initial DM education includes basics of disease pathophysiology, s/sx ofhyperglycemia, s/sx of hypoglycemia, initial likelihood to have hypoglycemic symptoms even if not hypoglycemic. 2. Work on diet to decrease sugary drinks. Discussed carbohydrate intake goals and reviewed low glycemic diet. 3. Discussed starting metformin along with diet and exercise. Pt agreeable.  Start metformin 500 mg once daily x 2 weeks.  Increase to metformin 500 mg twice daily with meals until next appointment. 4. Check CBG am fasting, preprandial and postprandial rotating breakfast, lunch and dinner.  Also  check HS sugar. Discussed that pt will not need to continue checking this many blood sugars daily in future, only for establishing patterns of eating habits and glucose control. 5. Follow up 4 weeks.      Relevant Medications   metFORMIN (GLUCOPHAGE) 500 MG tablet   blood glucose meter kit and supplies KIT   Other Relevant Orders   Lipid panel (Completed)   COMPLETE METABOLIC PANEL WITH GFR (Completed)   POCT glycosylated hemoglobin (Hb A1C) (Completed)    Other Visit Diagnoses    Urinary frequency    -  Primary Pt w/ primary symptom of polyuria. Indicates undiagnosed DM.  Plan: 1. Eval UA. 2. Proceed w/ random glucose and POCT a1c.   Relevant Orders   POCT Urinalysis Dipstick (Completed)   POCT glycosylated hemoglobin (Hb A1C) (Completed)   POCT glucose (manual entry) (Completed)   Encounter to establish care     Pt without recent PCP.  Symptomatic w/ polyuria.  Needs annual physical.      Meds ordered this encounter  Medications  . amoxicillin-clavulanate (AUGMENTIN) 875-125 MG tablet    Sig: Take by mouth.  Marland Kitchen glucosamine-chondroitin 500-400 MG tablet    Sig: Take 1 tablet by mouth 3 (three) times daily.  . metFORMIN (GLUCOPHAGE) 500 MG tablet    Sig: Take 1 tablet (500 mg total) by mouth 2 (two) times daily with a meal.    Dispense:  60 tablet    Refill:  1    Order Specific Question:    Supervising Provider    Answer:   Olin Hauser [[id number]]  . blood glucose meter kit and supplies KIT    Sig: Dispense based on patient and insurance preference. Use up to four times daily as directed. (FOR ICD-9 250.00, 250.01).    Dispense:  1 each    Refill:  0    Order Specific Question:   Supervising Provider    Answer:   Olin Hauser [[id number]]    Order Specific Question:   Number of strips    Answer:   100    Order Specific Question:   Number of lancets    Answer:   100     Follow up plan: Return in about 4 weeks (around 02/24/2017) for diabetes.  Cassell Smiles, DNP, AGPCNP-BC Adult Gerontology Primary Care Nurse Practitioner [facility] 01/27/2017, 1:31 PM

## 2017-01-30 ENCOUNTER — Other Ambulatory Visit: Payer: 59

## 2017-01-31 LAB — LIPID PANEL
Cholesterol: 139 mg/dL (ref <200)
HDL: 19 mg/dL — ABNORMAL LOW (ref >40)
LDL Cholesterol: 63 mg/dL (ref <100)
Total CHOL/HDL Ratio: 7.3 Ratio — ABNORMAL HIGH (ref <5.0)
Triglycerides: 286 mg/dL — ABNORMAL HIGH (ref <150)
VLDL: 57 mg/dL — ABNORMAL HIGH (ref <30)

## 2017-01-31 LAB — COMPLETE METABOLIC PANEL WITH GFR
ALT: 14 U/L (ref 9–46)
AST: 15 U/L (ref 10–40)
Albumin: 4.1 g/dL (ref 3.6–5.1)
Alkaline Phosphatase: 133 U/L — ABNORMAL HIGH (ref 40–115)
BUN: 19 mg/dL (ref 7–25)
CO2: 23 mmol/L (ref 20–31)
Calcium: 8.7 mg/dL (ref 8.6–10.3)
Chloride: 95 mmol/L — ABNORMAL LOW (ref 98–110)
Creat: 0.76 mg/dL (ref 0.60–1.35)
GFR, Est African American: >89 mL/min (ref >=60)
GFR, Est Non African American: >89 mL/min (ref >=60)
Glucose, Bld: 209 mg/dL — ABNORMAL HIGH (ref 65–99)
Potassium: 4.1 mmol/L (ref 3.5–5.3)
Sodium: 131 mmol/L — ABNORMAL LOW (ref 135–146)
Total Bilirubin: 0.3 mg/dL (ref 0.2–1.2)
Total Protein: 7.3 g/dL (ref 6.1–8.1)

## 2017-02-06 NOTE — Progress Notes (Signed)
I have reviewed this encounter including the documentation in this note and/or discussed this patient with the provider, Wilhelmina McardleLauren Kennedy, AGPCNP-BC. I am certifying that I agree with the content of this note as supervising physician.  Saralyn PilarAlexander Blair Mesina, DO [facility] 02/06/2017, 6:27 PM

## 2017-02-20 ENCOUNTER — Telehealth: Payer: Self-pay | Admitting: Nurse Practitioner

## 2017-02-20 ENCOUNTER — Other Ambulatory Visit: Payer: Self-pay | Admitting: Nurse Practitioner

## 2017-02-20 ENCOUNTER — Other Ambulatory Visit: Payer: Self-pay

## 2017-02-20 NOTE — Telephone Encounter (Signed)
[facility] pt is aware.

## 2017-02-20 NOTE — Telephone Encounter (Signed)
Pt has appt on Friday but is out of test strips and lancets.  Please send refill to [facility] in [place].  His call back number is [phone]

## 2017-02-24 ENCOUNTER — Ambulatory Visit (INDEPENDENT_AMBULATORY_CARE_PROVIDER_SITE_OTHER): Payer: 59 | Admitting: Nurse Practitioner

## 2017-02-24 ENCOUNTER — Encounter: Payer: Self-pay | Admitting: Nurse Practitioner

## 2017-02-24 VITALS — BP 107/54 | HR 78 | Temp 98.3°F | Ht 68.0 in | Wt 203.0 lb

## 2017-02-24 DIAGNOSIS — E119 Type 2 diabetes mellitus without complications: Secondary | ICD-10-CM

## 2017-02-24 MED ORDER — METFORMIN HCL 500 MG PO TABS: 1000.0000 mg | ORAL_TABLET | Freq: Two times a day (BID) | ORAL | 5 refills | Status: DC

## 2017-02-24 NOTE — Assessment & Plan Note (Signed)
Controlled in last 2 weeks w/ am fasting blood sugars nearing goal of less than 120.  Controlled w/ 500 mg metformin bid, diet, and exercise.  No postprandial hyperglycemia disproportionate to preprandial blood sugars.  Plan: 1. Continue checking CBG only once daily fasting in am. 2. Continue working toward improving diet and increasing exercise.   3. Follow up 3 months.

## 2017-02-24 NOTE — Patient Instructions (Addendum)
Richard Villarreal, Thank you for coming in to clinic today. GREAT WORK with controlling your blood sugars!  1. For your metformin: - Increase to two pills in evening and continue your one pill in the morning for 2 weeks. - After two weeks, start taking two pills in morning and evening.  2. Increase physical activity with increased Heart Rate to 30 minutes most days (4-5) of the week.  3. Continue your diet changes! These are all great steps that you have accomplish.  4. Continue checking only a morning fasting blood sugar and keeping a log. - Please schedule an eye exam with a retina exam and please have that sent via fax to us here at [phone].  5. START taking fish oil 1,000 mg once daily.  Please schedule a follow-up appointment with Richard McardleLauren Elsia Villarreal, AGNP to Return in about 3 months (around 05/27/2017) for Diabetes.  If you have any other questions or concerns, please feel free to call the clinic or send a message through MyChart. You may also schedule an earlier appointment if necessary.  Richard McardleLauren Lamae Fosco, DNP, AGNP-BC Adult Gerontology Nurse Practitioner [facility] [place] [facility], [facility]

## 2017-02-24 NOTE — Progress Notes (Signed)
I have reviewed this encounter including the documentation in this note and/or discussed this patient with the provider, Wilhelmina McardleLauren Kennedy, AGPCNP-BC. I am certifying that I agree with the content of this note as supervising physician.  Saralyn PilarAlexander Josephine Rudnick, DO [facility] [place] [facility] 02/24/2017, 5:25 PM

## 2017-02-24 NOTE — Progress Notes (Signed)
Subjective:    Patient ID: Richard Villarreal, male    DOB: 08/04/1981, 36 y.o.   MRN: [id number]  Richard Villarreal is a 36 y.o. male presenting on 02/24/2017 for Diabetes   HPI Diabetes Pt presents today for follow up of Type 2 diabetes mellitus. He is checking CBG at home with a range of 101-205 in the last 2 weeks.   At last visit requested rotating pre/post prandial x 4 sugar checks per day as initial evaluation of diabetes and pt response to meals.   - AM fasting: 156-114 w/ last week typically 115-130 - Postprandial breakfast: w/in 30 mg/dL of preprandial most days - Preprandial lunch:115-135 - Postprandial lunch: w/in 30 mg/dL of preprandial most days - Preprandial dinner:101-149 - Postprandial dinner: w/in 30 mg/dL of preprandial most days - Bedtime:99-170  Current diabetes medications include: metformin 500 mg breakfast and dinner tolerating well with no GI or other side effects.   He has no current symptoms of hyperglycemia, hypoglycemia, or complications. He does report headaches, polyuria, polydipsia, and polyphagia in the first 2 weeks since last visit.  In the last 2 weeks he denies polydipsia, polyphagia, polyuria, headaches, diaphoresis, shakiness, chills, pain, numbness or tingling in extremities and changes in vision.  Clinical course has been improving.  He  reports an exercise routine that includes walking at the park for about 45-1 hr 2-3 days per week.  His diet is moderate in salt, moderate in fat, and low in carbohydrates. He has avoided all sugary drinks and substitutes artificial sweeteners when able. He has utilized the low glycemic handout and is eating mostly moderate to low glycemic foods when choosing carbohydrates.  Eye exam current (within one year): no  prior eye exam about 5 years ago revealed "Left macular retinopathy" per pt Weight trend: stable  Recent Labs  01/27/17 1112  HGBA1C 11.8     Social History  Substance Use Topics  . Smoking status:  Current Every Day Smoker    Packs/day: 0.50    Years: 13.00    Types: Cigarettes  . Smokeless tobacco: Never Used     Comment: VAPES ALSO  . Alcohol use No     Comment: SOCIALLY    Review of Systems Per HPI unless specifically indicated above     Objective:    BP (!) 107/54 (BP Location: Right Arm, Patient Position: Sitting, Cuff Size: Normal)   Pulse 78   Temp 98.3 F (36.8 C) (Oral)   Ht 5\' 8"  (1.727 m)   Wt 203 lb (92.1 kg)   BMI 30.87 kg/m   Wt Readings from Last 3 Encounters:  02/24/17 203 lb (92.1 kg)  01/27/17 204 lb 3.2 oz (92.6 kg)  09/09/16 230 lb (104.3 kg)    Physical Exam General - healthy, well-appearing, NAD HEENT - Normocephalic, atraumatic Neck - supple, non-tender, no LAD, no carotid bruit Heart - RRR, no murmurs heard Lungs - Clear throughout all lobes, no wheezing, crackles, or rhonchi. Normal work of breathing. Abdomen - soft, NTND, no masses, no hepatosplenomegaly, active bowel sounds Extremeties - non-tender, no edema, cap refill < 2 seconds, peripheral pulses intact +2 bilaterally Skin - warm, dry, no rashes Neuro - awake, alert, oriented x3 Psych - Normal mood and affect, normal behavior   Results for orders placed or performed in visit on 01/27/17  Lipid panel  Result Value Ref Range   Cholesterol 139 <200 mg/dL   Triglycerides 782 (H) <150 mg/dL   HDL 19 (L) >95 mg/dL  Total CHOL/HDL Ratio 7.3 (H) <5.0 Ratio   VLDL 57 (H) <30 mg/dL   LDL Cholesterol 63 <161<100 mg/dL  COMPLETE METABOLIC PANEL WITH GFR  Result Value Ref Range   Sodium 131 (L) 135 - 146 mmol/L   Potassium 4.1 3.5 - 5.3 mmol/L   Chloride 95 (L) 98 - 110 mmol/L   CO2 23 20 - 31 mmol/L   Glucose, Bld 209 (H) 65 - 99 mg/dL   BUN 19 7 - 25 mg/dL   Creat 0.960.76 0.450.60 - 4.091.35 mg/dL   Total Bilirubin 0.3 0.2 - 1.2 mg/dL   Alkaline Phosphatase 133 (H) 40 - 115 U/L   AST 15 10 - 40 U/L   ALT 14 9 - 46 U/L   Total Protein 7.3 6.1 - 8.1 g/dL   Albumin 4.1 3.6 - 5.1 g/dL   Calcium  8.7 8.6 - 81.110.3 mg/dL   GFR, Est African American >89 >=60 mL/min   GFR, Est Non African American >89 >=60 mL/min  POCT Urinalysis Dipstick  Result Value Ref Range   Color, UA yellow    Clarity, UA clear    Glucose, UA 2,000    Bilirubin, UA neg    Ketones, UA 80    Spec Grav, UA 1.010 1.010 - 1.025   Blood, UA trace    pH, UA 5.0 5.0 - 8.0   Protein, UA neg    Urobilinogen, UA 0.2 0.2 or 1.0 E.U./dL   Nitrite, UA neg    Leukocytes, UA Negative Negative  POCT glycosylated hemoglobin (Hb A1C)  Result Value Ref Range   Hemoglobin A1C 11.8   POCT glucose (manual entry)  Result Value Ref Range   POC Glucose 448 (A) 70 - 99 mg/dl      Assessment & Plan:   Problem List Items Addressed This Visit      Endocrine   Diabetes mellitus type II, controlled (HCC) - Primary    Controlled in last 2 weeks w/ am fasting blood sugars nearing goal of less than 120.  Controlled w/ 500 mg metformin bid, diet, and exercise.  No postprandial hyperglycemia disproportionate to preprandial blood sugars.  Plan: 1. Continue checking CBG only once daily fasting in am. 2. Continue working toward improving diet and increasing exercise.   3. Follow up 3 months.      Relevant Medications   metFORMIN (GLUCOPHAGE) 500 MG tablet      Meds ordered this encounter  Medications  . metFORMIN (GLUCOPHAGE) 500 MG tablet    Sig: Take 2 tablets (1,000 mg total) by mouth 2 (two) times daily with a meal.    Dispense:  120 tablet    Refill:  5    Order Specific Question:   Supervising Provider    Answer:   Smitty CordsKARAMALEGOS, ALEXANDER J [[id number]]      Follow up plan: Return in about 3 months (around 05/27/2017) for Diabetes.   A total of 30 minutes was spent face-to-face with this patient. Greater than 50% of this time was spent in counseling and coordination of care with the patient discussing diabetes disease, common complications, body's insulin response to meals and elevated sugar, and s/sx of hyper and  hypoglycemia.   Wilhelmina McardleLauren Paarth Cropper, DNP, AGPCNP-BC Adult Gerontology Primary Care Nurse Practitioner [facility] [place] [facility] 02/24/2017, 1:28 PM

## 2017-02-25 LAB — COMPREHENSIVE METABOLIC PANEL
ALT: 12 U/L (ref 9–46)
AST: 16 U/L (ref 10–40)
Albumin: 4.1 g/dL (ref 3.6–5.1)
Alkaline Phosphatase: 116 U/L — ABNORMAL HIGH (ref 40–115)
BUN: 14 mg/dL (ref 7–25)
CO2: 22 mmol/L (ref 20–31)
Calcium: 9.2 mg/dL (ref 8.6–10.3)
Chloride: 101 mmol/L (ref 98–110)
Creat: 0.83 mg/dL (ref 0.60–1.35)
Glucose, Bld: 111 mg/dL — ABNORMAL HIGH (ref 65–99)
Potassium: 4.2 mmol/L (ref 3.5–5.3)
Sodium: 136 mmol/L (ref 135–146)
Total Bilirubin: 0.4 mg/dL (ref 0.2–1.2)
Total Protein: 7.1 g/dL (ref 6.1–8.1)

## 2017-05-02 ENCOUNTER — Encounter: Payer: Self-pay | Admitting: Nurse Practitioner

## 2017-05-02 ENCOUNTER — Other Ambulatory Visit: Payer: Self-pay

## 2017-05-02 ENCOUNTER — Other Ambulatory Visit: Payer: Self-pay | Admitting: Nurse Practitioner

## 2017-05-02 MED ORDER — GLUCOSE BLOOD VI STRP: ORAL_STRIP | 3 refills | Status: AC

## 2017-05-02 MED ORDER — ONETOUCH DELICA LANCETS 33G MISC: 100.0000 | 2 refills | Status: AC

## 2017-06-02 ENCOUNTER — Ambulatory Visit: Payer: 59 | Admitting: Nurse Practitioner

## 2017-06-05 ENCOUNTER — Encounter: Payer: Self-pay | Admitting: Nurse Practitioner

## 2017-06-05 ENCOUNTER — Ambulatory Visit (INDEPENDENT_AMBULATORY_CARE_PROVIDER_SITE_OTHER): Payer: 59 | Admitting: Nurse Practitioner

## 2017-06-05 VITALS — BP 119/65 | HR 85 | Temp 98.0°F | Ht 68.0 in | Wt 193.8 lb

## 2017-06-05 DIAGNOSIS — F17200 Nicotine dependence, unspecified, uncomplicated: Secondary | ICD-10-CM | POA: Insufficient documentation

## 2017-06-05 DIAGNOSIS — Z716 Tobacco abuse counseling: Secondary | ICD-10-CM | POA: Diagnosis not present

## 2017-06-05 DIAGNOSIS — E119 Type 2 diabetes mellitus without complications: Secondary | ICD-10-CM

## 2017-06-05 LAB — LIPID PANEL
Cholesterol: 127 mg/dL (ref <200)
HDL: 33 mg/dL — ABNORMAL LOW (ref >40)
LDL Cholesterol (Calc): 73 mg/dL (calc)
Non-HDL Cholesterol (Calc): 94 mg/dL (calc) (ref <130)
Total CHOL/HDL Ratio: 3.8 (calc) (ref <5.0)
Triglycerides: 119 mg/dL (ref <150)

## 2017-06-05 LAB — COMPREHENSIVE METABOLIC PANEL
AG Ratio: 1.5 (calc) (ref 1.0–2.5)
ALT: 11 U/L (ref 9–46)
AST: 14 U/L (ref 10–40)
Albumin: 4.5 g/dL (ref 3.6–5.1)
Alkaline phosphatase (APISO): 102 U/L (ref 40–115)
BUN: 13 mg/dL (ref 7–25)
CO2: 30 mmol/L (ref 20–32)
Calcium: 9.4 mg/dL (ref 8.6–10.3)
Chloride: 103 mmol/L (ref 98–110)
Creat: 0.87 mg/dL (ref 0.60–1.35)
Globulin: 3.1 g/dL (calc) (ref 1.9–3.7)
Glucose, Bld: 83 mg/dL (ref 65–99)
Potassium: 4.3 mmol/L (ref 3.5–5.3)
Sodium: 139 mmol/L (ref 135–146)
Total Bilirubin: 0.5 mg/dL (ref 0.2–1.2)
Total Protein: 7.6 g/dL (ref 6.1–8.1)

## 2017-06-05 LAB — POCT GLYCOSYLATED HEMOGLOBIN (HGB A1C): Hemoglobin A1C: 5.4

## 2017-06-05 MED ORDER — ATORVASTATIN CALCIUM 20 MG PO TABS: 20.0000 mg | ORAL_TABLET | Freq: Every day | ORAL | 11 refills | Status: AC

## 2017-06-05 MED ORDER — METFORMIN HCL ER 500 MG PO TB24: 500.0000 mg | ORAL_TABLET | Freq: Two times a day (BID) | ORAL | 5 refills | Status: DC

## 2017-06-05 MED ORDER — LISINOPRIL 2.5 MG PO TABS: 2.5000 mg | ORAL_TABLET | Freq: Every day | ORAL | 3 refills | Status: AC

## 2017-06-05 NOTE — Assessment & Plan Note (Signed)
Significantly improved w/ home CBG at goal and today's Hemoglobin A1c at 5.4%.  He has maintained diet changes w/ less sugar, no sugary drinks, and increased physical activity.  Pt taking metformin 1,000 mg bid and having diarrhea 2-3 times daily.  Plan: 1. Continue checking CBG only once daily fasting in am. 2. Continue improved diet and increased exercise.   3. STOP metformin. 4. START metformin ER 500 mg tablet.  Take 2 tablets once daily or 1 tablet twice daily depending on diarrhea symptoms. 5. A1c, lipid panel today. 6. START atorvastatin 20 mg once daily.  START lisinopril 2.5 mg once daily.   7. Obtain opthalmology exam. 8. Follow up 3 months.

## 2017-06-05 NOTE — Assessment & Plan Note (Addendum)
Pt states is ready to quit, but doesn't have motivation to quit within the next month.  Has approximately 7 pack-year smoking history. Has not tried to quit in past or cut back.  Plan: 1. Discussed needs to be fully mentally ready to quit for pharmacological options.  Pt defers until January 2019. 2. Reviewed options for treatment including, cutting back cigarettes smoked each day, nicotine replacement, Wellbutrin, Chantix. 3. Follow up in 3 months.  Discussion today >5 minutes (<10 minutes) specifically on counseling on risks of tobacco use, complications, treatment, smoking cessation.

## 2017-06-05 NOTE — Progress Notes (Signed)
Subjective:    Patient ID: Richard Villarreal, male    DOB: 05/09/1981, 36 y.o.   MRN: [id number]  Richard DanielsJahue W Dimalanta is a 36 y.o. male presenting on 06/05/2017 for Diabetes   HPI Diabetes Pt presents today for follow up of Type 2 diabetes mellitus. He is checking fasting am CBG at home with a range of 80-105.  Self-reported and did not bring meter/log - Current diabetic medications include: metformin 1000 mg bid and having side effect of diarrhea 2-3 times daily.  He occasionally uses 1 tablet Immodium to avoid having bowel movements.  States it works for 2-3 days. - He is not currently symptomatic.  - He denies polydipsia, polyphagia, polyuria, headaches, diaphoresis, shakiness, chills, pain, numbness or tingling in extremities and changes in vision.   - Clinical course has been improving. - He  reports an exercise routine that includes walking, 3-5 days per week. - His diet is moderate in salt, moderate in fat, and moderate in carbohydrates. - Cut back all sugary drinks, diet sodas, water. - Weight trend: decreasing steadily  PREVENTION: Eye exam current (within one year): no (Macular Retinopathy - 2010) Foot exam current (within one year): no - lanced foot for Abscess January 30, 2017.  Had antibiotic treatment w/ healing described as "callused over"  Wound at distal end reopened approximately 2 weeks ago w/ clear drainage.  Notes it has completely closed 7 days ago, but still has a scab. Lipid/ASCVD risk reduction - on statin: Not today - willing to start if lipid panel abnormal. Kidney protection - on ace or arb: none today, willing to start.  Recent Labs  01/27/17 1112 06/05/17 0925  HGBA1C 11.8 5.4   Smoking Cessation Counseling Pt states he wants to quit and is nearly ready to quit.  Would set a quit date after the new year, but not sooner.  States smokes most when he is at home.  His wife also smokes and her proximity while smoking makes it harder for him to avoid smoking a  cigarette.  Has not previously tried cutting back or using any nicotine replacement therapy outside of ecig.  Social History  Substance Use Topics  . Smoking status: Current Every Day Smoker    Packs/day: 0.50    Years: 13.00    Types: Cigarettes, E-cigarettes  . Smokeless tobacco: Never Used  . Alcohol use No     Comment: SOCIALLY    Review of Systems Per HPI unless specifically indicated above     Objective:    BP 119/65 (BP Location: Right Arm, Patient Position: Sitting, Cuff Size: Normal)   Pulse 85   Temp 98 F (36.7 C) (Oral)   Ht 5\' 8"  (1.727 m)   Wt 193 lb 12.8 oz (87.9 kg)   BMI 29.47 kg/m   Wt Readings from Last 3 Encounters:  06/05/17 193 lb 12.8 oz (87.9 kg)  02/24/17 203 lb (92.1 kg)  01/27/17 204 lb 3.2 oz (92.6 kg)    Physical Exam  General - healthy, well-appearing, NAD HEENT - Normocephalic, atraumatic Neck - supple, non-tender, no LAD, no thyromegaly, no carotid bruit Heart - RRR, no murmurs heard Lungs - Clear throughout all lobes, no wheezing, crackles, or rhonchi. Normal work of breathing. Abdomen - soft, NTND, no masses, no hepatosplenomegaly, active bowel sounds Extremeties - non-tender, no edema, cap refill < 2 seconds, peripheral pulses intact +2 bilaterally Skin - warm, dry, delayed healing of wound on right ankle (see foot exam). Neuro - awake, alert,  oriented x3, normal gait Psych - Normal mood and affect, normal behavior    Diabetic Foot Exam - Simple   Simple Foot Form Diabetic Foot exam was performed with the following findings:  Yes 06/05/2017  9:00 AM  Visual Inspection See comments:  Yes Sensation Testing Intact to touch and monofilament testing bilaterally:  Yes Pulse Check Posterior Tibialis and Dorsalis pulse intact bilaterally:  Yes Comments Skin deformity of right medial ankle.  Pt has had surgery by podiatry for ankle injury w/ subsequent abscess.  Delayed healing w/ reopening of wound about 2 weeks ago.  Now is scabbed  over again.  No s/sx of cellulitis, active abscess.      Results for orders placed or performed in visit on 06/05/17  POCT glycosylated hemoglobin (Hb A1C)  Result Value Ref Range   Hemoglobin A1C 5.4       Assessment & Plan:   Problem List Items Addressed This Visit      Endocrine   Type 2 diabetes mellitus without complication, without long-term current use of insulin (HCC) - Primary    Significantly improved w/ home CBG at goal and today's Hemoglobin A1c at 5.4%.  He has maintained diet changes w/ less sugar, no sugary drinks, and increased physical activity.  Pt taking metformin 1,000 mg bid and having diarrhea 2-3 times daily.  Plan: 1. Continue checking CBG only once daily fasting in am. 2. Continue improved diet and increased exercise.   3. STOP metformin. 4. START metformin ER 500 mg tablet.  Take 2 tablets once daily or 1 tablet twice daily depending on diarrhea symptoms. 5. A1c, lipid panel today. 6. START atorvastatin 20 mg once daily.  START lisinopril 2.5 mg once daily.   7. Obtain opthalmology exam. 8. Follow up 3 months.      Relevant Medications   lisinopril (PRINIVIL,ZESTRIL) 2.5 MG tablet   metFORMIN (GLUCOPHAGE-XR) 500 MG 24 hr tablet   atorvastatin (LIPITOR) 20 MG tablet   Other Relevant Orders   POCT glycosylated hemoglobin (Hb A1C) (Completed)   Lipid panel   Comprehensive metabolic panel     Other   Current every day smoker    Pt states is ready to quit, but doesn't have motivation to quit within the next month.  Has approximately 7 pack-year smoking history. Has not tried to quit in past or cut back.  Plan: 1. Discussed needs to be fully mentally ready to quit for pharmacological options.  Pt defers until January 2019. 2. Reviewed options for treatment including, cutting back cigarettes smoked each day, nicotine replacement, Wellbutrin, Chantix. 3. Follow up in 3 months.  Discussion today >5 minutes (<10 minutes) specifically on counseling on  risks of tobacco use, complications, treatment, smoking cessation.        Other Visit Diagnoses    Encounter for smoking cessation counseling       See AP current everyday smoker.      Meds ordered this encounter  Medications  . lisinopril (PRINIVIL,ZESTRIL) 2.5 MG tablet    Sig: Take 1 tablet (2.5 mg total) by mouth daily.    Dispense:  90 tablet    Refill:  3    Order Specific Question:   Supervising Provider    Answer:   Smitty Cords [[id number]]  . metFORMIN (GLUCOPHAGE-XR) 500 MG 24 hr tablet    Sig: Take 1 tablet (500 mg total) by mouth 2 (two) times daily.    Dispense:  60 tablet    Refill:  5  Order Specific Question:   Supervising Provider    Answer:   Smitty Cords [[id number]]  . atorvastatin (LIPITOR) 20 MG tablet    Sig: Take 1 tablet (20 mg total) by mouth daily.    Dispense:  30 tablet    Refill:  11    Order Specific Question:   Supervising Provider    Answer:   Smitty Cords [[id number]]      Follow up plan: Return in about 3 months (around 09/05/2017) for diabetes.  A total of 35 minutes including smoking cessation time was spent face-to-face with this patient. Greater than 50% of this time was spent in counseling and coordination of care with the patient.  [name], DNP, AGPCNP-BC Adult Gerontology Primary Care Nurse Practitioner [facility] 06/05/2017, 1:12 PM

## 2017-06-05 NOTE — Patient Instructions (Addendum)
Richard Villarreal, Thank you for coming in to clinic today.  1. For your diabetes: - GREAT WORK! - Continue your increased physical activity and improved diet. - Labs today: will be released to MyChart when results are available. - Call your insurance to ask about your eye exam.  Retinal exam by Opthalmology. Your provider would like to you have your annual eye exam. Please contact your current eye doctor or here are some good options for you to contact.   [facility]        Address: [address], [place], [place] [address]   Phone: [phone]      Website: [email or web address]    [facility] Address: [address], [place], [place] [address]  Phone: [phone] Website: [email or web address]   [facility]     [facility]  [address], [place], [place] [address]  [address], [place], [place]  Phone: [phone]    Phone: [phone]   [facility] Address: [address], [place], [place] [address]  Phone: [phone]  Medicines: - START metformin EX 1,000 mg per day. - START lisinopril 2.5 mg once per day. - START atorvastatin 20 mg once per day.  Please schedule a follow-up appointment with Wilhelmina McardleLauren Haleigh Desmith, AGNP. Return in about 3 months (around 09/05/2017) for diabetes.  If you have any other questions or concerns, please feel free to call the clinic or send a message through MyChart. You may also schedule an earlier appointment if necessary.  You will receive a survey after today's visit either digitally by e-mail or paper by [employer] mail. Your experiences and feedback matter to us.  Please respond so we know how we are doing as we provide care for you.   Wilhelmina McardleLauren Qusay Villada, DNP, AGNP-BC Adult Gerontology Nurse Practitioner [facility] [place] [facility], [facility]

## 2017-06-19 ENCOUNTER — Encounter: Payer: Self-pay | Admitting: Nurse Practitioner

## 2017-06-19 ENCOUNTER — Ambulatory Visit (INDEPENDENT_AMBULATORY_CARE_PROVIDER_SITE_OTHER): Payer: 59 | Admitting: Nurse Practitioner

## 2017-06-19 VITALS — BP 115/63 | HR 99 | Temp 98.5°F | Ht 68.0 in | Wt 192.8 lb

## 2017-06-19 DIAGNOSIS — S81801S Unspecified open wound, right lower leg, sequela: Secondary | ICD-10-CM

## 2017-06-19 DIAGNOSIS — J029 Acute pharyngitis, unspecified: Secondary | ICD-10-CM | POA: Diagnosis not present

## 2017-06-19 DIAGNOSIS — S81809A Unspecified open wound, unspecified lower leg, initial encounter: Secondary | ICD-10-CM | POA: Insufficient documentation

## 2017-06-19 LAB — POCT RAPID STREP A (OFFICE): Rapid Strep A Screen: NEGATIVE

## 2017-06-19 MED ORDER — SULFAMETHOXAZOLE-TRIMETHOPRIM 800-160 MG PO TABS: 1.0000 | ORAL_TABLET | Freq: Two times a day (BID) | ORAL | 0 refills | Status: AC

## 2017-06-19 MED ORDER — AMOXICILLIN-POT CLAVULANATE 875-125 MG PO TABS: 1.0000 | ORAL_TABLET | Freq: Two times a day (BID) | ORAL | 0 refills | Status: AC

## 2017-06-19 NOTE — Patient Instructions (Addendum)
Richard Villarreal, Thank you for coming in to clinic today.  1. For your ankle: You likely still have some skin infection present.  Keep your appointment w/ ortho.  TODAY: start both antibiotics for your infection.  These cover for MRSA. - START amoxicillin-clavulanate 875-125 mg one tablet twice daily for 10 days. - START TMP-SMX 800-160mg  one tablet twice daily for 10 days.  2. FOR your upper respiratory symptoms: 1. It sounds like you have a Upper Respiratory Virus - this will most likely run it's course in 7 to 10 days. Recommend good hand washing. - Start loratadine 10mg  daily, also can use Flonase 2 sprays each nostril daily for up to 4-6 weeks - If congestion is worse, start OTC Mucinex (or may try Mucinex-DM for cough) up to 7-10 days then stop - Drink plenty of fluids to improve congestion - You may try over the counter Nasal Saline spray (Simply Saline, Ocean Spray) as needed to reduce congestion. - Drink warm herbal tea with honey for sore throat. - Start taking Tylenol extra strength 1 to 2 tablets every 6-8 hours for aches or fever/chills for next few days as needed.  Do not take more than 3,000 mg in 24 hours from all medicines.  May take Ibuprofen as well if tolerated 200-400mg  every 8 hours as needed.  If symptoms significantly worsening with persistent fevers/chills despite tylenol/ibpurofen, nausea, vomiting unable to tolerate food/fluids or medicine, body aches, or shortness of breath, sinus pain pressure or worsening productive cough, then follow-up for re-evaluation, may seek more immediate care at Urgent Care or ED if more concerned for emergency.  Please schedule a follow-up appointment with Wilhelmina McardleLauren Teniyah Seivert, AGNP. Return 3-5 days if symptoms worsen or fail to improve.  If you have any other questions or concerns, please feel free to call the clinic or send a message through MyChart. You may also schedule an earlier appointment if necessary.  You will receive a survey after today's  visit either digitally by e-mail or paper by [employer] mail. Your experiences and feedback matter to us.  Please respond so we know how we are doing as we provide care for you.   Wilhelmina McardleLauren Trevyn Lumpkin, DNP, AGNP-BC Adult Gerontology Nurse Practitioner [facility] [place] [facility], [facility]

## 2017-06-19 NOTE — Assessment & Plan Note (Signed)
Non-healing right ankle wound after ORIF right ankle on 09/09/2016 w/ intermittent purulent drainage.  Ankle infection noted by orthopedics on 01/16/17 w/ first round abx Augmentin only w/ some improvement but not full healing.   - Initial diagnosis of T2DM was made on January 27, 2017 w/ currently controlled A1c. - Pt has upcoming ortho appointment for 06/21/17.  Plan: 1. START coverage for SSTI for MRSA coverage w/ TMP-SMX bid x 10 days and Augmentin bid x 10 days. 2. Encouraged pt to continue care w/ orthopedics as planned. 3. Continue good glucose control. 4. Followup as needed.

## 2017-06-19 NOTE — Progress Notes (Signed)
Subjective:    Patient ID: Richard Villarreal, male    DOB: 08/08/1981, 36 y.o.   MRN: [id number]  Richard Villarreal is a 35 y.o. male presenting on 06/19/2017 for Sore Throat (fatigue,scratch throat and cough x 5 days. Pt states he's been expose to strep throat.)   HPI URI Pt reports symptoms of scratchy throat, cough, general malaise/fatigue for last 5 days.  His son has been sick and tested positive for strep throat.  He has also noted increased rhinorrhea, but also admits to having allergies in spring and fall.  He is not taking anything for allergies at this time.  Right Ankle Pt continues to have problems with his right ankle not healing fully. Had initial surgery for ORIF 09/09/2016.  Ankle infection noted 01/16/17 w/ first round Augmentin, which was his only antibiotic course.  Pt had improvement w/ this treatment despite the wound not fully healing.  Of note, initial diagnosis of T2DM was made on January 27, 2017 which has significantly improved since that time.    Has had drainage twice since his last visit with me and has an appointment w/ orthopedic Dr. Ether Griffins for ankle Wednesday. 1. Friday (3 days ago) he had another "fluid bubble under skin to burst."  Pt states it has no foul odor, but does have purulence from the drainage.  2. About 10 days ago, he had same symptoms, but w/ small amount of purulent fluid and significant amount of blood.   Social History   Tobacco Use  . Smoking status: Current Every Day Smoker    Packs/day: 0.50    Years: 13.00    Pack years: 6.50    Types: Cigarettes, E-cigarettes  . Smokeless tobacco: Never Used  Substance Use Topics  . Alcohol use: No    Comment: SOCIALLY  . Drug use: No    Review of Systems Per HPI unless specifically indicated above     Objective:    BP 115/63 (BP Location: Right Arm, Patient Position: Sitting, Cuff Size: Normal)   Pulse 99   Temp 98.5 F (36.9 C) (Oral)   Ht 5\' 8"  (1.727 m)   Wt 192 lb 12.8 oz (87.5 kg)   BMI  29.32 kg/m   Wt Readings from Last 3 Encounters:  06/19/17 192 lb 12.8 oz (87.5 kg)  06/05/17 193 lb 12.8 oz (87.9 kg)  02/24/17 203 lb (92.1 kg)    Physical Exam  Constitutional: He appears well-developed and well-nourished. No distress.  HENT:  Head: Normocephalic.  Right Ear: Hearing, tympanic membrane, external ear and ear canal normal.  Left Ear: Hearing, tympanic membrane, external ear and ear canal normal.  Nose: Mucosal edema and rhinorrhea present. Right sinus exhibits no maxillary sinus tenderness and no frontal sinus tenderness. Left sinus exhibits no maxillary sinus tenderness and no frontal sinus tenderness.  Mouth/Throat: Uvula is midline and mucous membranes are normal. Posterior oropharyngeal edema and posterior oropharyngeal erythema present.  Skin: Skin is warm and dry. Lesion noted.        Results for orders placed or performed in visit on 06/19/17  POCT rapid strep A  Result Value Ref Range   Rapid Strep A Screen Negative Negative      Assessment & Plan:   Problem List Items Addressed This Visit      Other   Non-healing wound of lower extremity    Non-healing right ankle wound after ORIF right ankle on 09/09/2016 w/ intermittent purulent drainage.  Ankle infection noted by orthopedics  on 01/16/17 w/ first round abx Augmentin only w/ some improvement but not full healing.   - Initial diagnosis of T2DM was made on January 27, 2017 w/ currently controlled A1c. - Pt has upcoming ortho appointment for 06/21/17.  Plan: 1. START coverage for SSTI for MRSA coverage w/ TMP-SMX bid x 10 days and Augmentin bid x 10 days. 2. Encouraged pt to continue care w/ orthopedics as planned. 3. Continue good glucose control. 4. Followup as needed.       Other Visit Diagnoses    Sore throat    -  Primary Acute illness. Afebrile.  Symptoms not worsening. Consistent with viral illness x 5 days with known sick contacts and no identifiable focal infections of ears, nose, throat.   Rapid strep is negative.  Plan: 1. Reassurance, likely self-limited with cough lasting up to few weeks - Start anti-histamine Loratadine 10mg  daily,  - also can use Flonase 2 sprays each nostril daily for up to 4-6 weeks - Start Mucinex-DM OTC up to 7-10 days then stop 2. Supportive care with nasal saline, warm herbal tea with honey, 3. Improve hydration 4. Tylenol / Motrin PRN fevers 5. Return criteria given   Relevant Orders   POCT rapid strep A (Completed)      Meds ordered this encounter  Medications  . DISCONTD: metFORMIN (GLUCOPHAGE) 500 MG tablet  . amoxicillin-clavulanate (AUGMENTIN) 875-125 MG tablet    Sig: Take 1 tablet 2 (two) times daily for 10 days by mouth.    Dispense:  20 tablet    Refill:  0    Order Specific Question:   Supervising Provider    Answer:   Smitty CordsKARAMALEGOS, ALEXANDER J [[id number]]  . sulfamethoxazole-trimethoprim (BACTRIM DS,SEPTRA DS) 800-160 MG tablet    Sig: Take 1 tablet 2 (two) times daily for 10 days by mouth.    Dispense:  20 tablet    Refill:  0    Order Specific Question:   Supervising Provider    Answer:   Smitty CordsKARAMALEGOS, ALEXANDER J [[id number]]    Follow up plan: Return 3-5 days if symptoms worsen or fail to improve.   Wilhelmina McardleLauren Saori Umholtz, DNP, AGPCNP-BC Adult Gerontology Primary Care Nurse Practitioner [facility] 06/19/2017, 11:54 AM

## 2017-08-04 ENCOUNTER — Other Ambulatory Visit: Payer: Self-pay

## 2017-08-04 ENCOUNTER — Encounter: Payer: Self-pay | Admitting: Nurse Practitioner

## 2017-08-04 DIAGNOSIS — E119 Type 2 diabetes mellitus without complications: Secondary | ICD-10-CM

## 2017-08-04 MED ORDER — METFORMIN HCL ER 500 MG PO TB24: 500.0000 mg | ORAL_TABLET | Freq: Two times a day (BID) | ORAL | 5 refills | Status: AC

## 2017-09-11 ENCOUNTER — Ambulatory Visit: Payer: Self-pay | Admitting: Nurse Practitioner

## 2019-01-02 IMAGING — CR DG ANKLE COMPLETE 3+V*R*
3 series · 3 of 3 positions shown · non-contrast
Comparison: None.

CLINICAL DATA: 35 y/o  M; right ankle pain and swelling after fall.

EXAM:
RIGHT ANKLE - COMPLETE 3+ VIEW

[ankle ap]
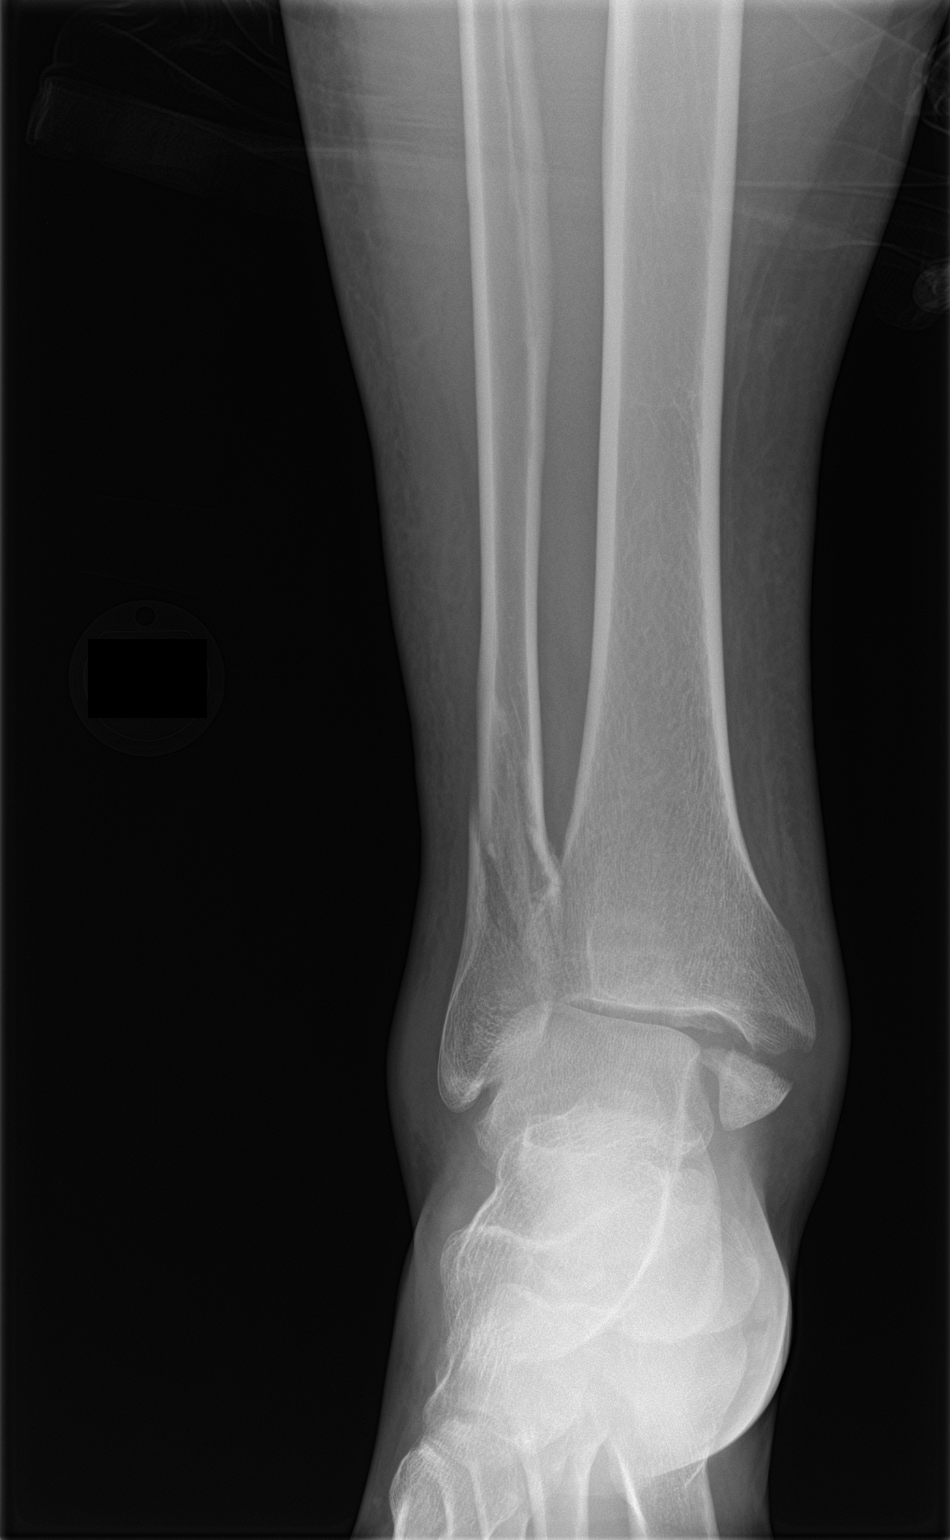

[ankle obl]
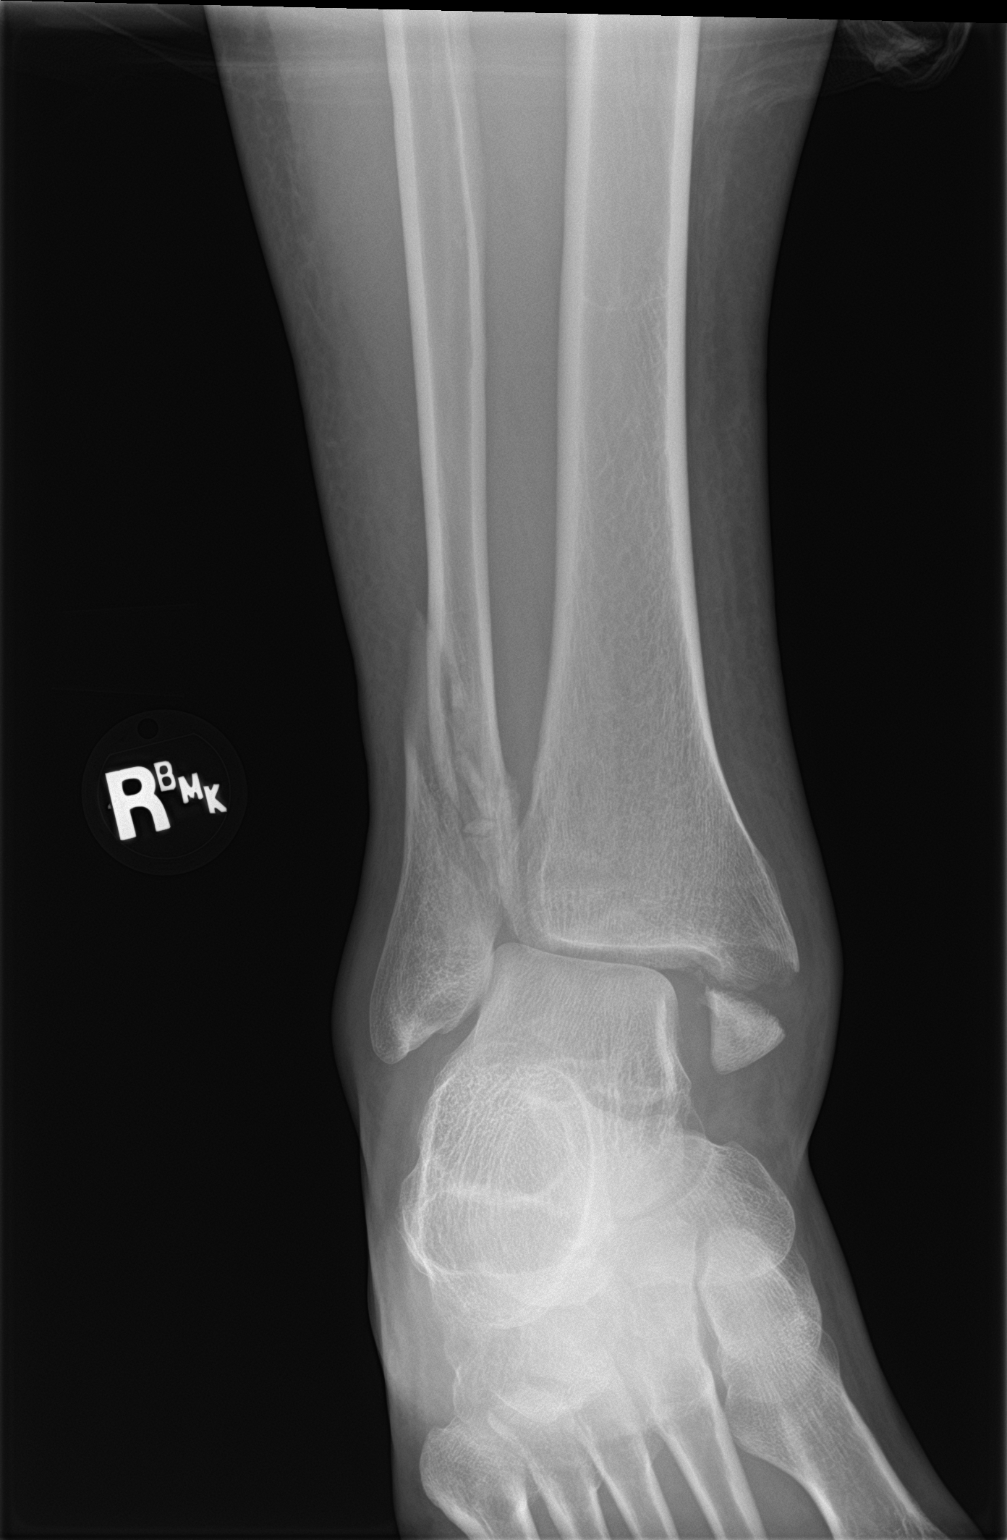

[ankle lat]
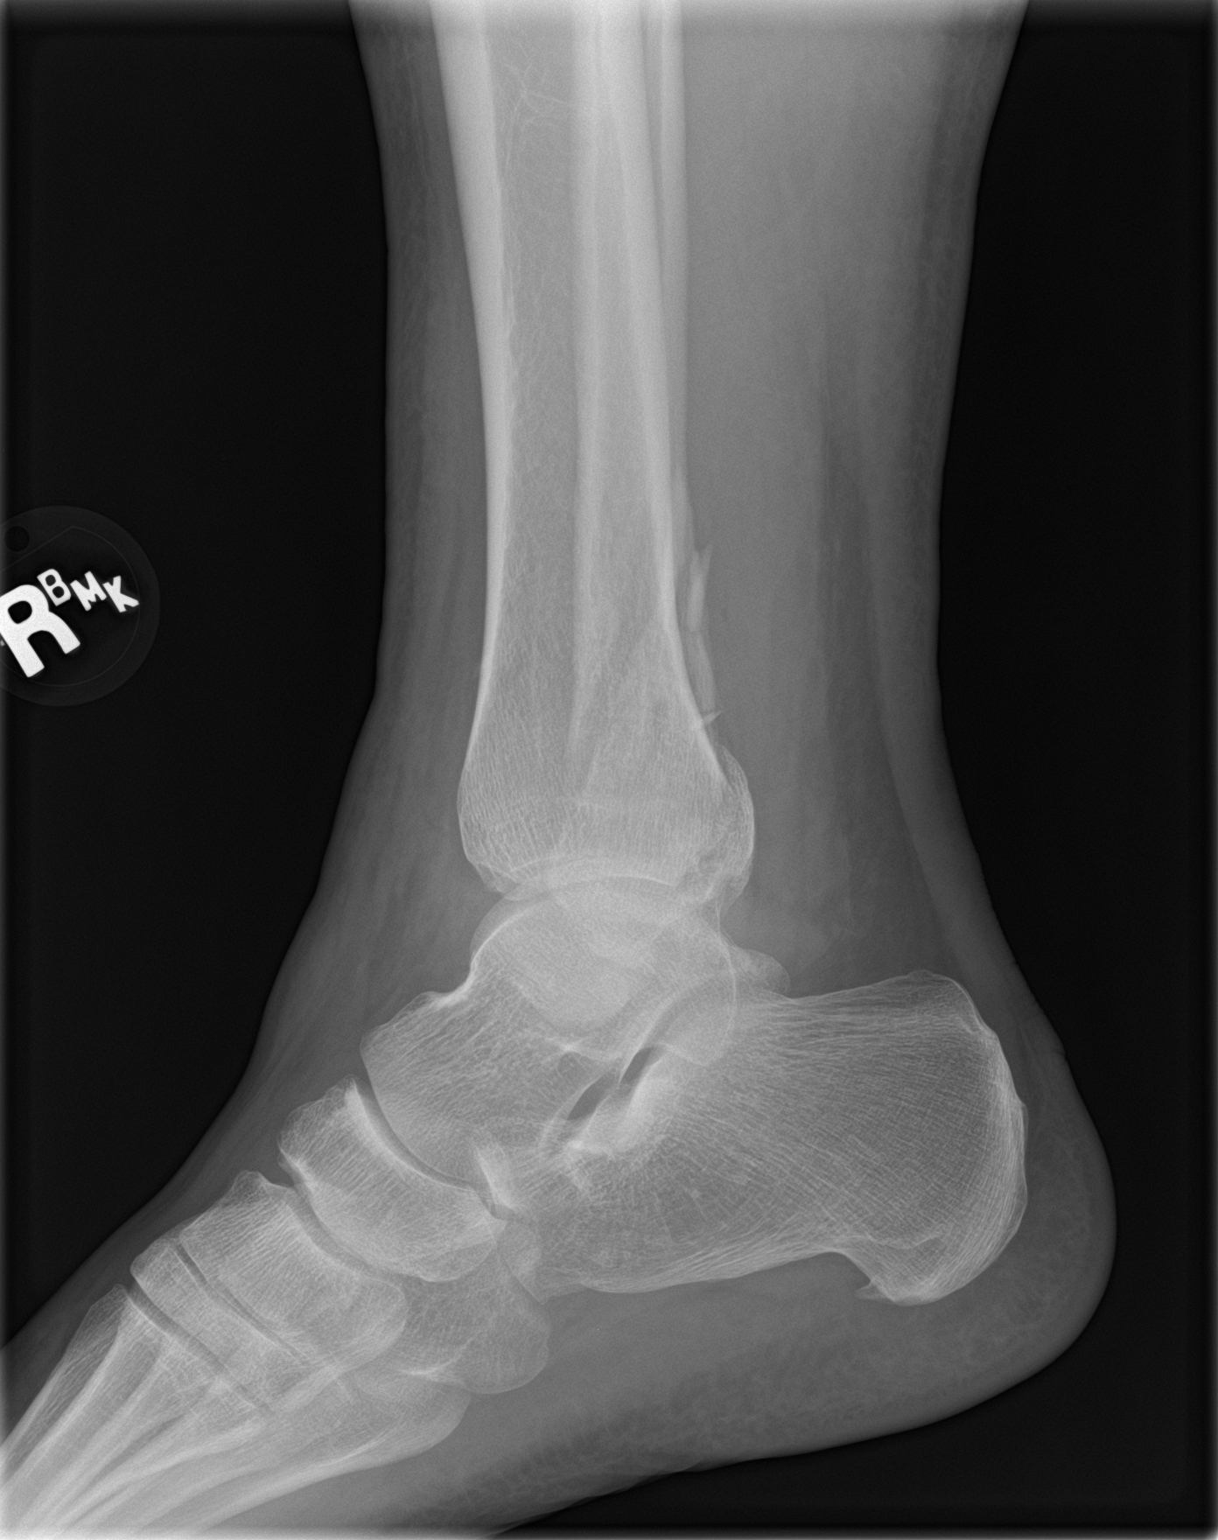

[3 of 3 positions shown; findings below may reference images not displayed]

FINDINGS: Comminuted oblique right lower fibular fracture above level of
tibial plafond. Moderate avulsion of the medial malleolus. Mildly
displaced posterior malleolar fracture. Talar dome is intact.
Widening of the medial ankle mortise indicates joint instability.
Soft tissue swelling about the ankle joint. Small plantar calcaneal
enthesophyte.
IMPRESSION: Lower fibular fracture above level of tibial plafond. Moderately
displaced medial malleolus fracture. Mildly displaced posterior
malleolus fracture. Lateral subluxation of talar dome indicating
joint instability.

By: Fitzroy Ramalho M.D.

## 2019-01-09 IMAGING — XA DG C-ARM 61-120 MIN
5 series · 5 of 5 positions shown · non-contrast
Comparison: 09/02/2016

CLINICAL DATA: Internal fixation

EXAM:
RIGHT ANKLE - 2 VIEW; DG C-ARM 61-120 MIN

[Series 1: ortho standard · 1 of 1 slices shown (1 of 5)]
[im 1/1]
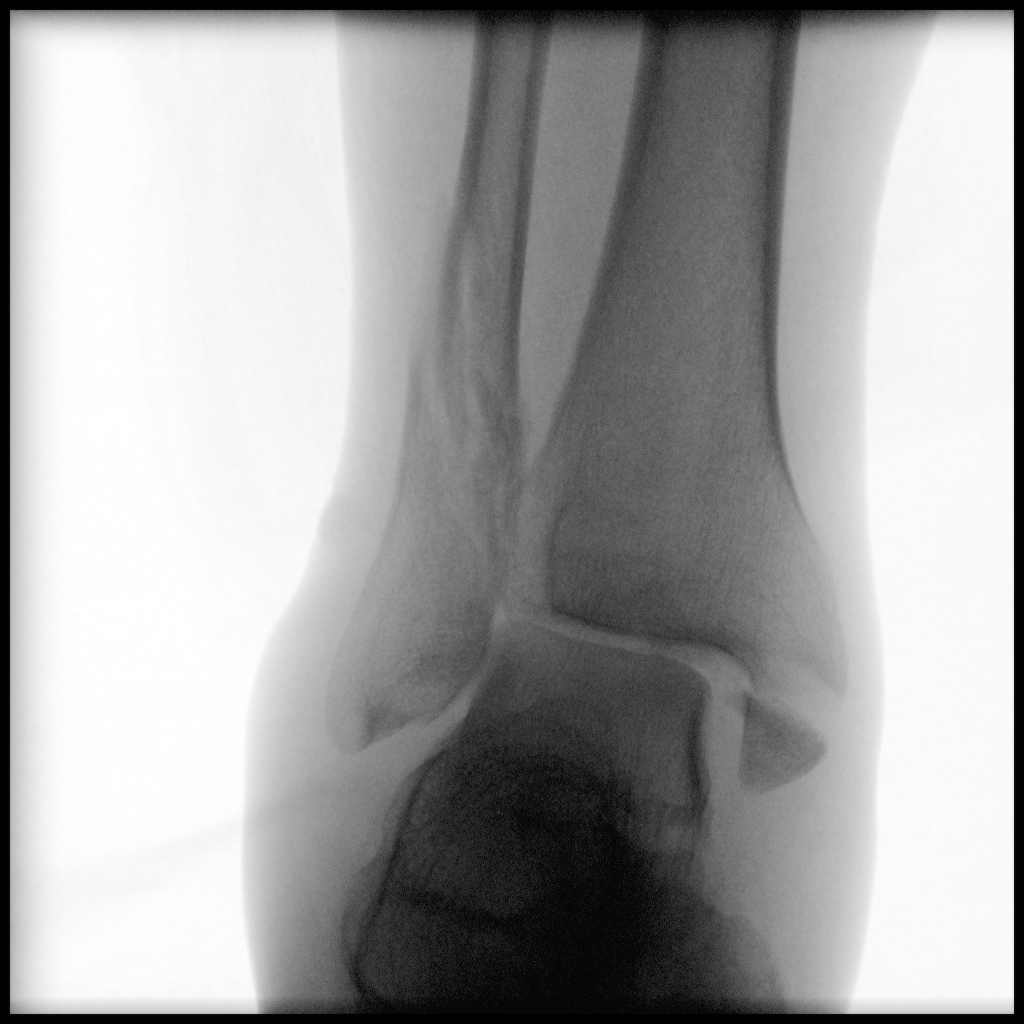

[Series 2: ortho standard · 1 of 1 slices shown (2 of 5)]
[im 1/1]
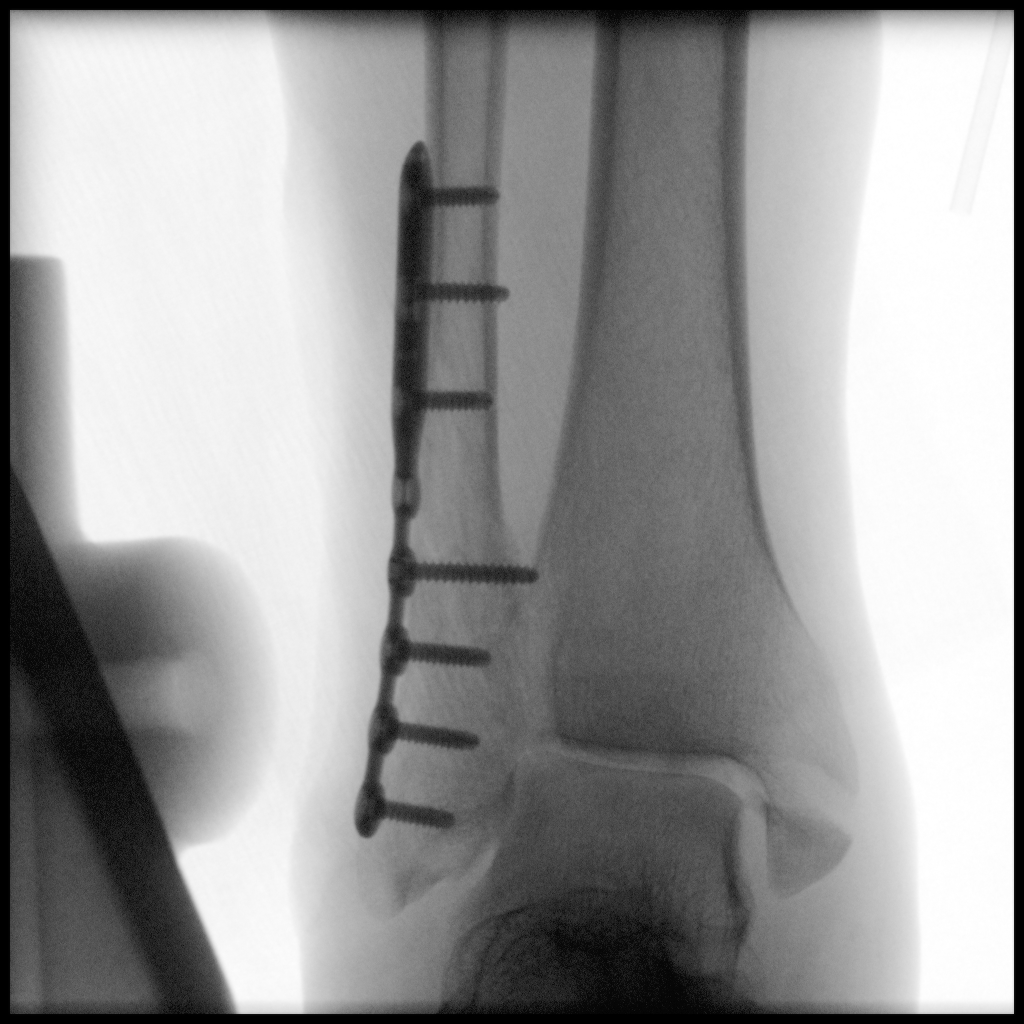

[Series 3: ortho standard · 1 of 1 slices shown (3 of 5)]
[im 1/1]
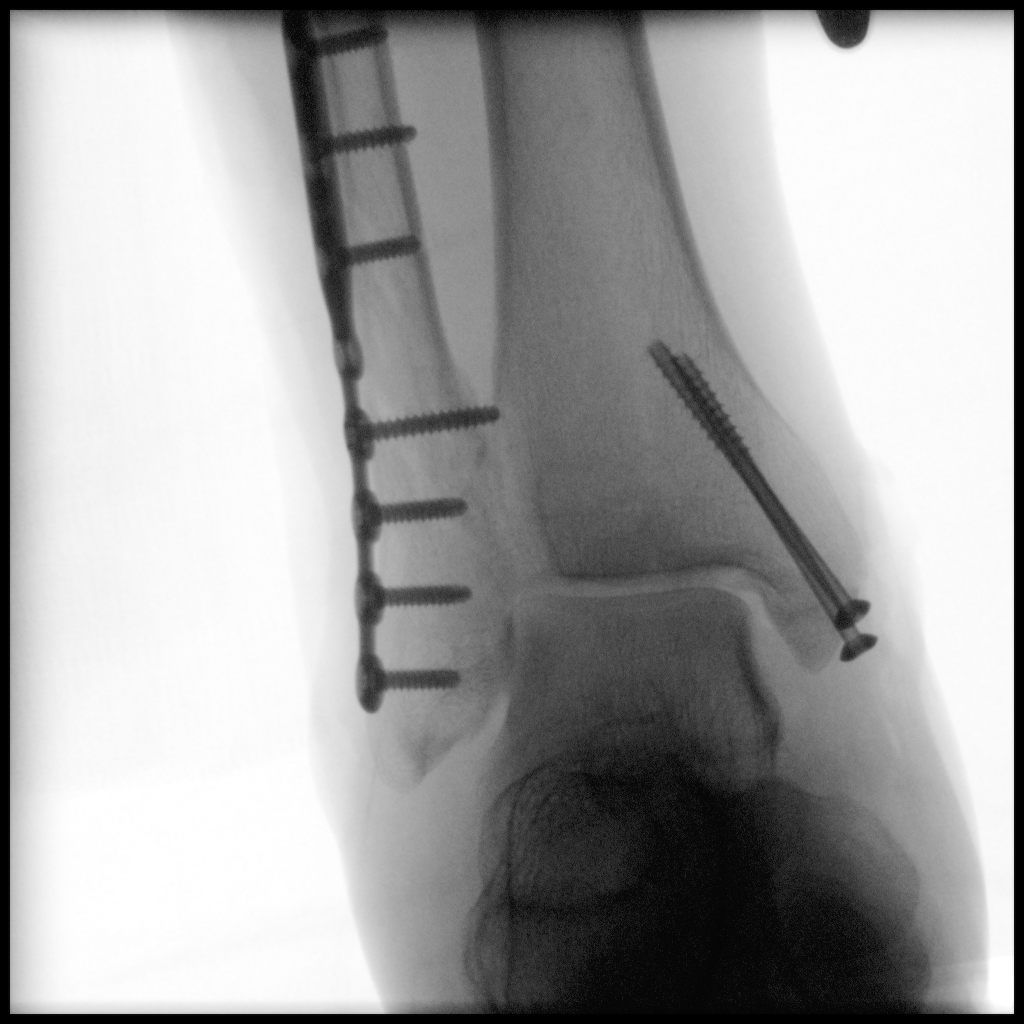

[Series 4: ortho standard · 1 of 1 slices shown (4 of 5)]
[im 1/1]
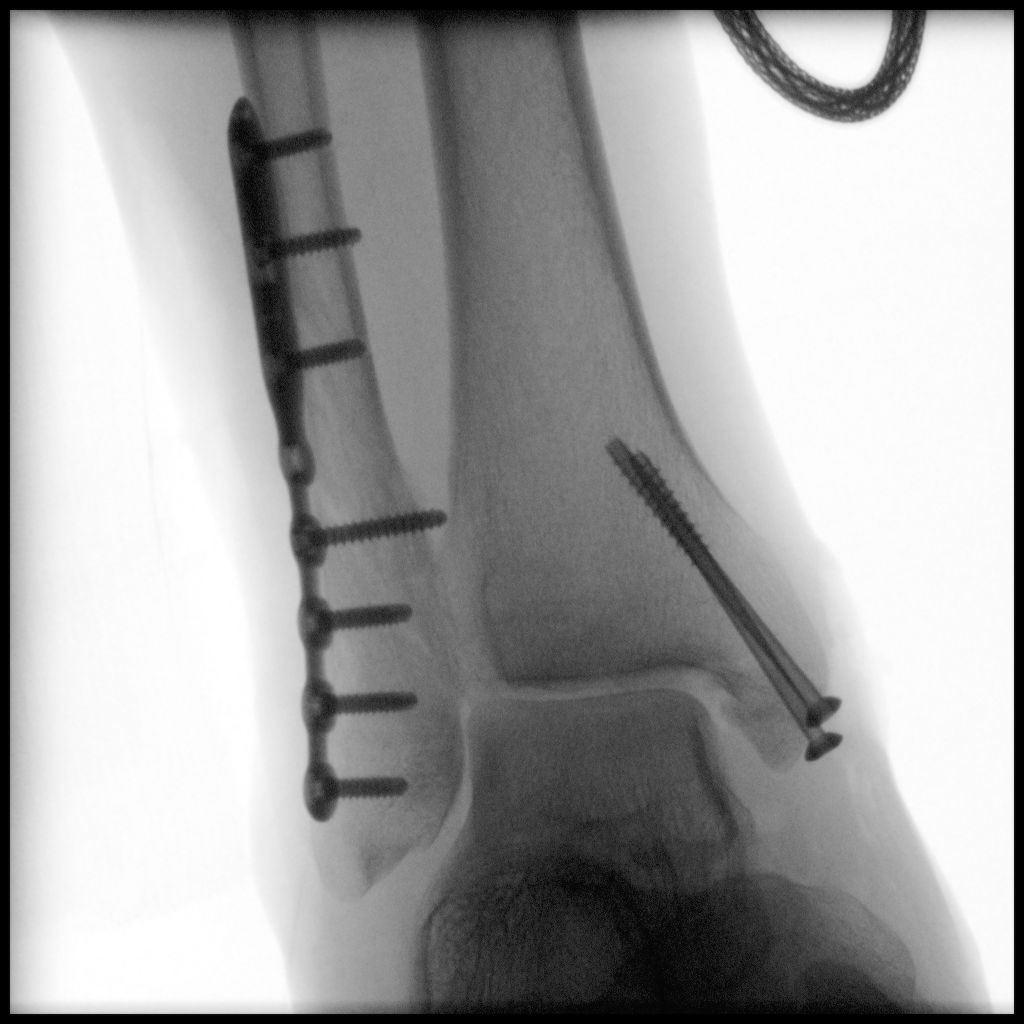

[Series 5: ortho standard · 1 of 1 slices shown (5 of 5)]
[im 1/1]
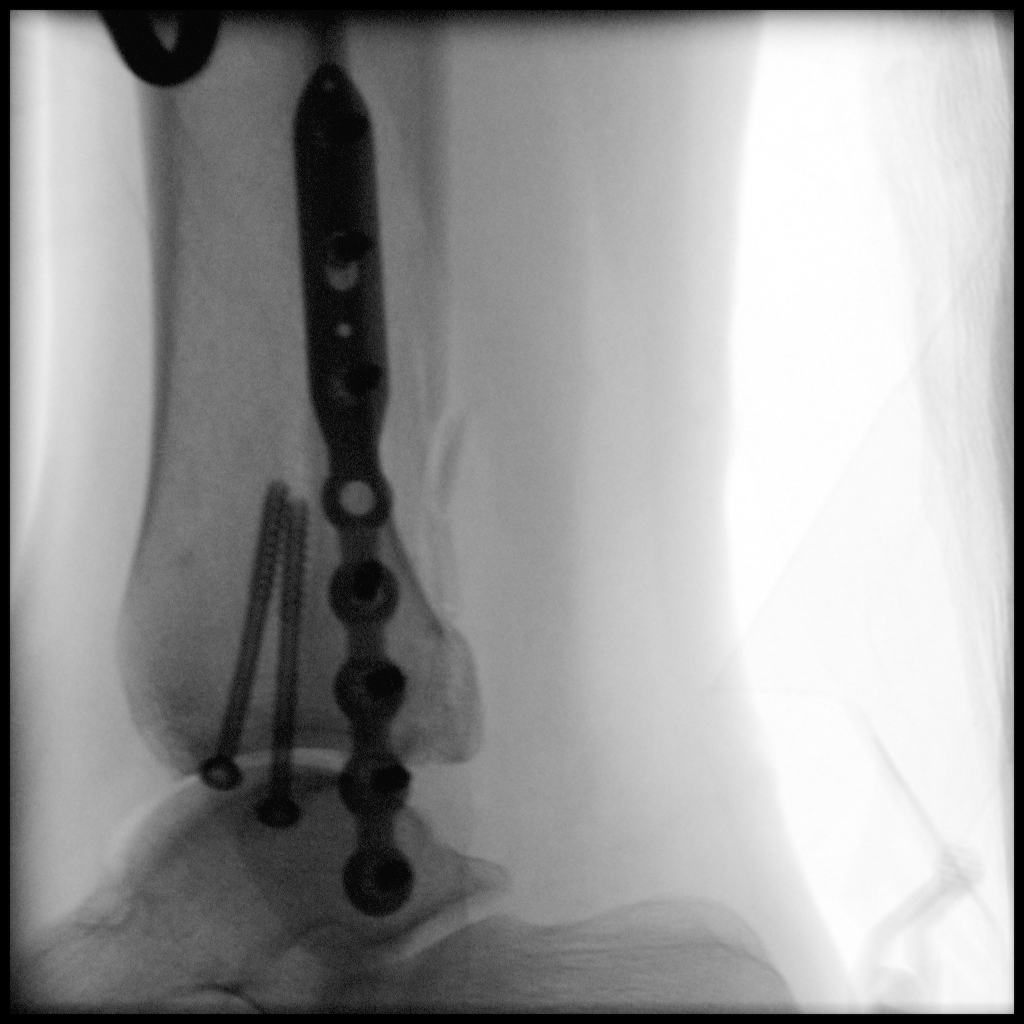

[5 of 5 positions shown; findings below may reference images not displayed]

FINDINGS: Multiple intraoperative spot images demonstrate internal fixation
across the distal fibular and medial malleolar fractures. No
hardware complicating feature. Anatomic alignment.
IMPRESSION: Internal fixation across the distal fibular and medial malleolar
fractures. No complicating feature.
